# Patient Record
Sex: Male | Born: 1990 | Race: Black or African American | Hispanic: No | Marital: Married
Health system: Northeastern US, Academic
[De-identification: ages and names within clinical notes are randomized; demographics above are authoritative.]

## PROBLEM LIST (undated history)

## (undated) DIAGNOSIS — I10 Essential (primary) hypertension: Secondary | ICD-10-CM

## (undated) DIAGNOSIS — J45909 Unspecified asthma, uncomplicated: Secondary | ICD-10-CM

## (undated) MED FILL — WEGOVY 2.4 MG/0.75 ML SUBCUTANEOUS PEN INJECTOR: 2.4 2.4 mg/0.75 mL | SUBCUTANEOUS | 28 days supply | Qty: 3 | Fill #3 | Status: CN

## (undated) MED FILL — WEGOVY 2.4 MG/0.75 ML SUBCUTANEOUS PEN INJECTOR: 2.4 2.4 mg/0.75 mL | SUBCUTANEOUS | 28 days supply | Qty: 3 | Fill #9 | Status: CN

## (undated) MED FILL — WEGOVY 2.4 MG/0.75 ML SUBCUTANEOUS PEN INJECTOR: 2.4 2.4 mg/0.75 mL | SUBCUTANEOUS | 28 days supply | Qty: 3 | Fill #0 | Status: CN

## (undated) MED FILL — ATOMOXETINE 80 MG CAPSULE: 80 80 mg | ORAL | 90 days supply | Qty: 90 | Fill #2 | Status: CN

---

## 2016-07-09 ENCOUNTER — Emergency Department (HOSPITAL_COMMUNITY): Payer: 59

## 2016-07-09 ENCOUNTER — Encounter (HOSPITAL_COMMUNITY): Payer: Self-pay

## 2016-07-09 ENCOUNTER — Observation Stay (HOSPITAL_COMMUNITY)
Admission: EM | Admit: 2016-07-09 | Discharge: 2016-07-11 | Disposition: A | Discharge status: Home / Self Care | Payer: 59 | Attending: General Surgery | Admitting: General Surgery

## 2016-07-09 DIAGNOSIS — I10 Essential (primary) hypertension: Secondary | ICD-10-CM | POA: Insufficient documentation

## 2016-07-09 DIAGNOSIS — J45909 Unspecified asthma, uncomplicated: Secondary | ICD-10-CM | POA: Diagnosis not present

## 2016-07-09 DIAGNOSIS — Z79899 Other long term (current) drug therapy: Secondary | ICD-10-CM | POA: Diagnosis not present

## 2016-07-09 DIAGNOSIS — K3580 Unspecified acute appendicitis: Principal | ICD-10-CM | POA: Diagnosis present

## 2016-07-09 DIAGNOSIS — R1031 Right lower quadrant pain: Secondary | ICD-10-CM | POA: Diagnosis present

## 2016-07-09 HISTORY — DX: Unspecified asthma, uncomplicated: J45.909

## 2016-07-09 HISTORY — DX: Essential (primary) hypertension: I10

## 2016-07-09 LAB — COMPREHENSIVE METABOLIC PANEL
ALBUMIN: 4.2 g/dL (ref 3.5–5.0)
ALK PHOS: 95 U/L (ref 38–126)
ALT: 36 U/L (ref 17–63)
AST: 24 U/L (ref 15–41)
Anion gap: 11 (ref 5–15)
BILIRUBIN TOTAL: 0.9 mg/dL (ref 0.3–1.2)
BUN: 6 mg/dL (ref 6–20)
CALCIUM: 10.1 mg/dL (ref 8.9–10.3)
CO2: 29 mmol/L (ref 22–32)
CREATININE: 1.2 mg/dL (ref 0.61–1.24)
Chloride: 99 mmol/L — ABNORMAL LOW (ref 101–111)
GFR calc Af Amer: >60 mL/min (ref >60)
GLUCOSE: 98 mg/dL (ref 65–99)
POTASSIUM: 3.9 mmol/L (ref 3.5–5.1)
Sodium: 139 mmol/L (ref 135–145)
TOTAL PROTEIN: 7.8 g/dL (ref 6.5–8.1)

## 2016-07-09 LAB — CBC
HCT: 43.4 % (ref 39.0–52.0)
Hemoglobin: 15 g/dL (ref 13.0–17.0)
MCH: 30.7 pg (ref 26.0–34.0)
MCHC: 34.6 g/dL (ref 30.0–36.0)
MCV: 88.8 fL (ref 78.0–100.0)
PLATELETS: 198 10*3/uL (ref 150–400)
RBC: 4.89 MIL/uL (ref 4.22–5.81)
RDW: 12.7 % (ref 11.5–15.5)
WBC: 11 10*3/uL — AB (ref 4.0–10.5)

## 2016-07-09 LAB — URINALYSIS, ROUTINE W REFLEX MICROSCOPIC
Bilirubin Urine: NEGATIVE
Glucose, UA: NEGATIVE mg/dL
Hgb urine dipstick: NEGATIVE
KETONES UR: NEGATIVE mg/dL
LEUKOCYTES UA: NEGATIVE
NITRITE: NEGATIVE
PH: 7 (ref 5.0–8.0)
PROTEIN: NEGATIVE mg/dL
Specific Gravity, Urine: 1.019 (ref 1.005–1.030)

## 2016-07-09 LAB — LIPASE, BLOOD: Lipase: 17 U/L (ref 11–51)

## 2016-07-09 MED ORDER — IOPAMIDOL (ISOVUE-300) INJECTION 61%
INTRAVENOUS | Status: AC
  Administered 2016-07-09: 100 mL
  Filled 2016-07-09: qty 100

## 2016-07-09 MED ORDER — SODIUM CHLORIDE 0.9 % IV SOLN
Freq: Once | INTRAVENOUS | Status: AC
  Administered 2016-07-09: 23:00:00 via INTRAVENOUS

## 2016-07-09 NOTE — ED Triage Notes (Signed)
Pt endorses RLQ pain that began yesterday with 1 episode of vomiting today. Denies diarrhea. Pt did take laxatives "because I thought I was constipated" VSS.

## 2016-07-09 NOTE — ED Provider Notes (Signed)
[facility]-EMERGENCY DEPT Provider Note   CSN: [id number] Arrival date & time: 07/09/16  2126     History   Chief Complaint Chief Complaint  Patient presents with  . Abdominal Pain    HPI Joshua ManorJohn Glenn is a 26 y.o. male.  This a normally healthy 26 year old male who states this morning he developed central diffuse abdominal pain that has now localized to his right lower quadrant.  He's had no appetite throughout the day had one episode of vomiting this morning.  He thought he was constipated, so he took a laxative.  He did have a normal bowel movement following laxative, which did not alleviate his pain. Is unable to stand straight, as this causes discomfort.      Past Medical History:  Diagnosis Date  . Asthma   . Hypertension     There are no active problems to display for this patient.   History reviewed. No pertinent surgical history.     Home Medications    Prior to Admission medications   Medication Sig Start Date End Date Taking? Authorizing Provider  albuterol (PROVENTIL HFA;VENTOLIN HFA) 108 (90 Base) MCG/ACT inhaler Inhale 2 puffs into the lungs every 6 (six) hours as needed for shortness of breath. 11/10/15 11/09/16 Yes Historical Provider, MD  lisinopril (PRINIVIL,ZESTRIL) 20 MG tablet Take 20 mg by mouth daily. 07/04/16  Yes Historical Provider, MD  magnesium hydroxide (MILK OF MAGNESIA) 400 MG/5ML suspension Take 30 mLs by mouth daily as needed for mild constipation.   Yes Historical Provider, MD  polyethylene glycol (MIRALAX / GLYCOLAX) packet Take 17 g by mouth daily as needed.   Yes Historical Provider, MD    Family History History reviewed. No pertinent family history.  Social History Social History  Substance Use Topics  . Smoking status: Never Smoker  . Smokeless tobacco: Never Used  . Alcohol use Yes     Comment: occ     Allergies   Amlodipine and Latex   Review of Systems Review of Systems  Constitutional: Positive for appetite change.  Negative for fever.  Gastrointestinal: Positive for abdominal pain, nausea and vomiting. Negative for abdominal distention, constipation and diarrhea.  Genitourinary: Negative for dysuria.  Neurological: Negative.   Hematological: Negative.   Psychiatric/Behavioral: Negative.   All other systems reviewed and are negative.    Physical Exam Updated Vital Signs BP 120/75   Pulse 89   Temp 99.3 F (37.4 C) (Oral)   Resp 18   Ht 5\' 10"  (1.778 m)   Wt 117.9 kg   SpO2 95%   BMI 37.31 kg/m   Physical Exam  Constitutional: He appears well-developed and well-nourished. No distress.  HENT:  Head: Normocephalic.  Eyes: Pupils are equal, round, and reactive to light.  Neck: Normal range of motion.  Cardiovascular: Normal rate.   Pulmonary/Chest: Effort normal.  Abdominal: He exhibits no distension. There is tenderness in the right lower quadrant. There is rebound and guarding.  Musculoskeletal: Normal range of motion.  Neurological: He is alert.  Skin: Skin is warm.  Psychiatric: He has a normal mood and affect.  Nursing note and vitals reviewed.    ED Treatments / Results  Labs (all labs ordered are listed, but only abnormal results are displayed) Labs Reviewed  COMPREHENSIVE METABOLIC PANEL - Abnormal; Notable for the following:       Result Value   Chloride 99 (*)    All other components within normal limits  CBC - Abnormal; Notable for the following:  WBC 11.0 (*)    All other components within normal limits  LIPASE, BLOOD  URINALYSIS, ROUTINE W REFLEX MICROSCOPIC    EKG  EKG Interpretation None       Radiology Ct Abdomen Pelvis W Contrast  Result Date: 07/10/2016 CLINICAL DATA:  Right lower quadrant abdominal pain EXAM: CT ABDOMEN AND PELVIS WITH CONTRAST TECHNIQUE: Multidetector CT imaging of the abdomen and pelvis was performed using the standard protocol following bolus administration of intravenous contrast. CONTRAST:  ISOVUE-300 IOPAMIDOL  (ISOVUE-300) INJECTION 61% COMPARISON:  None. FINDINGS: Lower chest: No acute abnormality. Hepatobiliary: No focal liver abnormality is seen. No gallstones, gallbladder wall thickening, or biliary dilatation. Pancreas: Unremarkable. No pancreatic ductal dilatation or surrounding inflammatory changes. Spleen: Normal in size without focal abnormality. Adrenals/Urinary Tract: Adrenal glands are unremarkable. Kidneys are normal, without renal calculi, focal lesion, or hydronephrosis. Bladder is unremarkable. Stomach/Bowel: Stomach is within normal limits. No evidence of bowel wall thickening, distention, or inflammatory changes. Abnormally enlarged appendix up to 13 mm. 9 mm appendicolith in the proximal lumen. Moderate inflammatory change surrounding the appendix. No perforation. No abscess. Sigmoid colon diverticular disease. Vascular/Lymphatic: Non aneurysmal aorta. Multiple right lower quadrant mesenteric lymph nodes likely reactive. Reproductive: Prostate is unremarkable. Other: Small free fluid in the pelvis.  No free air Musculoskeletal: No acute or significant osseous findings. IMPRESSION: 1. Findings consistent with acute appendicitis; there is a 9 mm appendicolith in the proximal lumen of the appendix. No free air. No abscess. 2. Small amount of free fluid in the pelvis 3. Sigmoid colon diverticular changes without acute inflammation Electronically Signed   By: Jasmine Pang M.D.   On: 07/10/2016 00:32    Procedures Procedures (including critical care time)  Medications Ordered in ED Medications  0.9 %  sodium chloride infusion ( Intravenous New Bag/Given 07/09/16 2315)  iopamidol (ISOVUE-300) 61 % injection (100 mLs  Contrast Given 07/09/16 2334)  HYDROmorphone (DILAUDID) injection 1 mg (1 mg Intravenous Given 07/10/16 0057)  ondansetron (ZOFRAN) injection 4 mg (4 mg Intravenous Given 07/10/16 0057)     Initial Impression / Assessment and Plan / ED Course  I have reviewed the triage vital signs  and the nursing notes.  Pertinent labs & imaging results that were available during my care of the patient were reviewed by me and considered in my medical decision making (see chart for details).      Will obtain CT scan, concern for acute appendicitis  Final Clinical Impressions(s) / ED Diagnoses   Final diagnoses:  Acute appendicitis, unspecified acute appendicitis type    New Prescriptions New Prescriptions   No medications on file     Earley Favor, NP 07/09/16 2320    Earley Favor, NP 07/10/16 0126    Laurence Spates, MD 07/10/16 [phone]

## 2016-07-09 NOTE — ED Notes (Signed)
Patient transported to CT 

## 2016-07-10 ENCOUNTER — Encounter (HOSPITAL_COMMUNITY)
Admission: EM | Disposition: A | Discharge status: Home / Self Care | Payer: Self-pay | Source: Home / Self Care | Attending: Emergency Medicine

## 2016-07-10 ENCOUNTER — Observation Stay (HOSPITAL_COMMUNITY): Payer: 59 | Admitting: Certified Registered"

## 2016-07-10 ENCOUNTER — Encounter (HOSPITAL_COMMUNITY): Payer: Self-pay | Admitting: Certified Registered"

## 2016-07-10 DIAGNOSIS — K3580 Unspecified acute appendicitis: Secondary | ICD-10-CM | POA: Diagnosis present

## 2016-07-10 HISTORY — PX: LAPAROSCOPIC APPENDECTOMY: SHX408

## 2016-07-10 SURGERY — APPENDECTOMY, LAPAROSCOPIC
Anesthesia: General | Site: Abdomen

## 2016-07-10 MED ORDER — MIDAZOLAM HCL 2 MG/2ML IJ SOLN
INTRAMUSCULAR | Status: AC
  Filled 2016-07-10: qty 2

## 2016-07-10 MED ORDER — FENTANYL CITRATE (PF) 100 MCG/2ML IJ SOLN
INTRAMUSCULAR | Status: AC
Start: 2016-07-10 — End: 2016-07-10
  Filled 2016-07-10: qty 2

## 2016-07-10 MED ORDER — SUCCINYLCHOLINE CHLORIDE 200 MG/10ML IV SOSY
PREFILLED_SYRINGE | INTRAVENOUS | Status: DC | PRN
  Administered 2016-07-10: 140 mg via INTRAVENOUS

## 2016-07-10 MED ORDER — HYDROCODONE-ACETAMINOPHEN 5-325 MG PO TABS
1.0000 | ORAL_TABLET | ORAL | Status: DC | PRN
  Administered 2016-07-10 – 2016-07-11 (×4): 1 via ORAL
  Filled 2016-07-10 (×4): qty 1

## 2016-07-10 MED ORDER — SUGAMMADEX SODIUM 200 MG/2ML IV SOLN
INTRAVENOUS | Status: DC | PRN
  Administered 2016-07-10: 250 mg via INTRAVENOUS

## 2016-07-10 MED ORDER — LIDOCAINE 2% (20 MG/ML) 5 ML SYRINGE
INTRAMUSCULAR | Status: AC
  Filled 2016-07-10: qty 5

## 2016-07-10 MED ORDER — DEXAMETHASONE SODIUM PHOSPHATE 10 MG/ML IJ SOLN
INTRAMUSCULAR | Status: AC
  Filled 2016-07-10: qty 1

## 2016-07-10 MED ORDER — HYDROMORPHONE HCL 1 MG/ML IJ SOLN: 0.2500 mg | INTRAMUSCULAR | Status: DC | PRN

## 2016-07-10 MED ORDER — MIDAZOLAM HCL 5 MG/5ML IJ SOLN
INTRAMUSCULAR | Status: DC | PRN
  Administered 2016-07-10: 2 mg via INTRAVENOUS

## 2016-07-10 MED ORDER — LIDOCAINE 2% (20 MG/ML) 5 ML SYRINGE
INTRAMUSCULAR | Status: DC | PRN
  Administered 2016-07-10: 100 mg via INTRAVENOUS

## 2016-07-10 MED ORDER — 0.9 % SODIUM CHLORIDE (POUR BTL) OPTIME
TOPICAL | Status: DC | PRN
  Administered 2016-07-10: 1000 mL

## 2016-07-10 MED ORDER — ROCURONIUM BROMIDE 10 MG/ML (PF) SYRINGE
PREFILLED_SYRINGE | INTRAVENOUS | Status: DC | PRN
  Administered 2016-07-10: 10 mg via INTRAVENOUS
  Administered 2016-07-10: 40 mg via INTRAVENOUS

## 2016-07-10 MED ORDER — PROPOFOL 10 MG/ML IV BOLUS
INTRAVENOUS | Status: DC | PRN
  Administered 2016-07-10: 150 mg via INTRAVENOUS

## 2016-07-10 MED ORDER — HYDROMORPHONE HCL 2 MG/ML IJ SOLN
1.0000 mg | Freq: Once | INTRAMUSCULAR | Status: AC
  Administered 2016-07-10: 1 mg via INTRAVENOUS
  Filled 2016-07-10: qty 1

## 2016-07-10 MED ORDER — ONDANSETRON HCL 4 MG/2ML IJ SOLN
4.0000 mg | Freq: Four times a day (QID) | INTRAMUSCULAR | Status: DC | PRN
  Administered 2016-07-10: 4 mg via INTRAVENOUS

## 2016-07-10 MED ORDER — LACTATED RINGERS IV SOLN
INTRAVENOUS | Status: DC
  Administered 2016-07-10: 09:00:00 via INTRAVENOUS

## 2016-07-10 MED ORDER — ENOXAPARIN SODIUM 60 MG/0.6ML ~~LOC~~ SOLN
60.0000 mg | SUBCUTANEOUS | Status: DC
  Administered 2016-07-10: 16:00:00 60 mg via SUBCUTANEOUS
  Filled 2016-07-10 (×2): qty 0.6

## 2016-07-10 MED ORDER — LISINOPRIL 20 MG PO TABS: 20.0000 mg | ORAL_TABLET | Freq: Every day | ORAL | Status: DC

## 2016-07-10 MED ORDER — SODIUM CHLORIDE 0.9 % IR SOLN
Status: DC | PRN
  Administered 2016-07-10: 1000 mL

## 2016-07-10 MED ORDER — ONDANSETRON HCL 4 MG/2ML IJ SOLN
4.0000 mg | Freq: Once | INTRAMUSCULAR | Status: AC
  Administered 2016-07-10: 4 mg via INTRAVENOUS
  Filled 2016-07-10: qty 2

## 2016-07-10 MED ORDER — METRONIDAZOLE IN NACL 5-0.79 MG/ML-% IV SOLN
500.0000 mg | Freq: Three times a day (TID) | INTRAVENOUS | Status: DC
  Administered 2016-07-10: 500 mg via INTRAVENOUS
  Filled 2016-07-10 (×2): qty 100

## 2016-07-10 MED ORDER — ONDANSETRON HCL 4 MG/2ML IJ SOLN
4.0000 mg | Freq: Once | INTRAMUSCULAR | Status: DC | PRN
Start: 2016-07-10 — End: 2016-07-10

## 2016-07-10 MED ORDER — BUPIVACAINE-EPINEPHRINE 0.25% -1:200000 IJ SOLN
INTRAMUSCULAR | Status: DC | PRN
  Administered 2016-07-10: 20 mL

## 2016-07-10 MED ORDER — BUPIVACAINE HCL (PF) 0.25 % IJ SOLN
INTRAMUSCULAR | Status: AC
  Filled 2016-07-10: qty 30

## 2016-07-10 MED ORDER — ONDANSETRON 4 MG PO TBDP: 4.0000 mg | ORAL_TABLET | Freq: Four times a day (QID) | ORAL | Status: DC | PRN

## 2016-07-10 MED ORDER — SUCCINYLCHOLINE CHLORIDE 200 MG/10ML IV SOSY
PREFILLED_SYRINGE | INTRAVENOUS | Status: AC
  Filled 2016-07-10: qty 10

## 2016-07-10 MED ORDER — ROCURONIUM BROMIDE 50 MG/5ML IV SOSY
PREFILLED_SYRINGE | INTRAVENOUS | Status: AC
  Filled 2016-07-10: qty 5

## 2016-07-10 MED ORDER — DEXAMETHASONE SODIUM PHOSPHATE 10 MG/ML IJ SOLN
INTRAMUSCULAR | Status: DC | PRN
  Administered 2016-07-10: 6 mg via INTRAVENOUS

## 2016-07-10 MED ORDER — DIPHENHYDRAMINE HCL 50 MG/ML IJ SOLN: 25.0000 mg | Freq: Four times a day (QID) | INTRAMUSCULAR | Status: DC | PRN

## 2016-07-10 MED ORDER — PROPOFOL 10 MG/ML IV BOLUS
INTRAVENOUS | Status: AC
  Filled 2016-07-10: qty 20

## 2016-07-10 MED ORDER — EPHEDRINE 5 MG/ML INJ
INTRAVENOUS | Status: AC
  Filled 2016-07-10: qty 10

## 2016-07-10 MED ORDER — POTASSIUM CHLORIDE IN NACL 20-0.9 MEQ/L-% IV SOLN
INTRAVENOUS | Status: DC
  Administered 2016-07-10 – 2016-07-11 (×4): via INTRAVENOUS
  Filled 2016-07-10 (×4): qty 1000

## 2016-07-10 MED ORDER — PHENYLEPHRINE 40 MCG/ML (10ML) SYRINGE FOR IV PUSH (FOR BLOOD PRESSURE SUPPORT)
PREFILLED_SYRINGE | INTRAVENOUS | Status: AC
  Filled 2016-07-10: qty 10

## 2016-07-10 MED ORDER — MEPERIDINE HCL 25 MG/ML IJ SOLN: 6.2500 mg | INTRAMUSCULAR | Status: DC | PRN

## 2016-07-10 MED ORDER — FENTANYL CITRATE (PF) 100 MCG/2ML IJ SOLN
INTRAMUSCULAR | Status: AC
  Filled 2016-07-10: qty 2

## 2016-07-10 MED ORDER — CEFTRIAXONE SODIUM 2 G IJ SOLR
2.0000 g | INTRAMUSCULAR | Status: DC
  Administered 2016-07-10: 2 g via INTRAVENOUS
  Filled 2016-07-10: qty 2

## 2016-07-10 MED ORDER — MORPHINE SULFATE (PF) 4 MG/ML IV SOLN
1.0000 mg | INTRAVENOUS | Status: DC | PRN
  Administered 2016-07-10 (×2): 4 mg via INTRAVENOUS
  Filled 2016-07-10 (×2): qty 1

## 2016-07-10 MED ORDER — DIPHENHYDRAMINE HCL 25 MG PO CAPS: 25.0000 mg | ORAL_CAPSULE | Freq: Four times a day (QID) | ORAL | Status: DC | PRN

## 2016-07-10 MED ORDER — FENTANYL CITRATE (PF) 100 MCG/2ML IJ SOLN
INTRAMUSCULAR | Status: DC | PRN
  Administered 2016-07-10: 100 ug via INTRAVENOUS
  Administered 2016-07-10 (×2): 50 ug via INTRAVENOUS

## 2016-07-10 SURGICAL SUPPLY — 34 items
APPLIER CLIP 5 13 M/L LIGAMAX5 (MISCELLANEOUS)
CANISTER SUCT 3000ML PPV (MISCELLANEOUS) ×3 IMPLANT
CHLORAPREP W/TINT 26ML (MISCELLANEOUS) ×3 IMPLANT
CLIP APPLIE 5 13 M/L LIGAMAX5 (MISCELLANEOUS) IMPLANT
CLIP LIGATING HEMO LOK XL GOLD (MISCELLANEOUS) ×3 IMPLANT
COVER SURGICAL LIGHT HANDLE (MISCELLANEOUS) ×3 IMPLANT
DERMABOND ADVANCED (GAUZE/BANDAGES/DRESSINGS) ×2
DERMABOND ADVANCED .7 DNX12 (GAUZE/BANDAGES/DRESSINGS) ×1 IMPLANT
ELECT REM PT RETURN 9FT ADLT (ELECTROSURGICAL) ×3
ELECTRODE REM PT RTRN 9FT ADLT (ELECTROSURGICAL) ×1 IMPLANT
GLOVE BIOGEL PI IND STRL 7.0 (GLOVE) ×1 IMPLANT
GLOVE BIOGEL PI INDICATOR 7.0 (GLOVE) ×2
GLOVE SURG SS PI 7.0 STRL IVOR (GLOVE) ×3 IMPLANT
GOWN STRL REUS W/ TWL LRG LVL3 (GOWN DISPOSABLE) ×2 IMPLANT
GOWN STRL REUS W/TWL LRG LVL3 (GOWN DISPOSABLE) ×4
GRASPER SUT TROCAR 14GX15 (MISCELLANEOUS) ×3 IMPLANT
KIT BASIN OR (CUSTOM PROCEDURE TRAY) ×3 IMPLANT
KIT ROOM TURNOVER OR (KITS) ×3 IMPLANT
NS IRRIG 1000ML POUR BTL (IV SOLUTION) ×3 IMPLANT
PAD ARMBOARD 7.5X6 YLW CONV (MISCELLANEOUS) ×6 IMPLANT
POUCH RETRIEVAL ECOSAC 10 (ENDOMECHANICALS) ×1 IMPLANT
POUCH RETRIEVAL ECOSAC 10MM (ENDOMECHANICALS) ×2
SCISSORS LAP 5X35 DISP (ENDOMECHANICALS) ×3 IMPLANT
SET IRRIG TUBING LAPAROSCOPIC (IRRIGATION / IRRIGATOR) ×3 IMPLANT
SLEEVE ENDOPATH XCEL 5M (ENDOMECHANICALS) ×3 IMPLANT
SPECIMEN JAR SMALL (MISCELLANEOUS) ×3 IMPLANT
SUT MNCRL AB 4-0 PS2 18 (SUTURE) ×3 IMPLANT
TOWEL OR 17X24 6PK STRL BLUE (TOWEL DISPOSABLE) ×3 IMPLANT
TOWEL OR 17X26 10 PK STRL BLUE (TOWEL DISPOSABLE) ×3 IMPLANT
TRAY FOLEY CATH 16FR SILVER (SET/KITS/TRAYS/PACK) IMPLANT
TRAY LAPAROSCOPIC MC (CUSTOM PROCEDURE TRAY) ×3 IMPLANT
TROCAR XCEL NON-BLD 11X100MML (ENDOMECHANICALS) ×3 IMPLANT
TROCAR XCEL NON-BLD 5MMX100MML (ENDOMECHANICALS) ×3 IMPLANT
TUBING INSUFFLATION (TUBING) ×3 IMPLANT

## 2016-07-10 NOTE — Progress Notes (Signed)
Pre Procedure note for inpatients:   Joshua Glenn has been scheduled for Procedure(s): APPENDECTOMY LAPAROSCOPIC (N/A) today. The various methods of treatment have been discussed with the patient. After consideration of the risks, benefits and treatment options the patient has consented to the planned procedure.   The patient has been seen and labs reviewed. There are no changes in the patient's condition to prevent proceeding with the planned procedure today.  Recent labs:  Lab Results  Component Value Date   WBC 11.0 (H) 07/09/2016   HGB 15.0 07/09/2016   HCT 43.4 07/09/2016   PLT 198 07/09/2016   GLUCOSE 98 07/09/2016   ALT 36 07/09/2016   AST 24 07/09/2016   NA 139 07/09/2016   K 3.9 07/09/2016   CL 99 (L) 07/09/2016   CREATININE 1.20 07/09/2016   BUN 6 07/09/2016   CO2 29 07/09/2016    Rodman PickleLuke Aaron Paco Cislo, MD 07/10/2016 9:29 AM

## 2016-07-10 NOTE — Anesthesia Procedure Notes (Signed)
Procedure Name: Intubation Date/Time: 07/10/2016 10:14 AM Performed by: Myna Bright Pre-anesthesia Checklist: Patient identified, Emergency Drugs available, Suction available and Patient being monitored Patient Re-evaluated:Patient Re-evaluated prior to inductionOxygen Delivery Method: Circle system utilized Preoxygenation: Pre-oxygenation with 100% oxygen Intubation Type: IV induction, Rapid sequence and Cricoid Pressure applied Laryngoscope Size: Mac and 4 Grade View: Grade I Tube type: Oral Tube size: 7.5 mm Number of attempts: 1 Airway Equipment and Method: Stylet Placement Confirmation: ETT inserted through vocal cords under direct vision,  positive ETCO2 and breath sounds checked- equal and bilateral Secured at: 22 cm Tube secured with: Tape Dental Injury: Teeth and Oropharynx as per pre-operative assessment

## 2016-07-10 NOTE — H&P (Signed)
Joshua Glenn is an 26 y.o. male.   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 He developed central abdominal pain yesterday that then progressed to the right lower quadrant. He had one episode of nausea and vomiting. The pain is described as sharp and moderate in intensity. A CT scan of the abdomen was performed showing acute appendicitis with appendicolith. He denies fevers. He is otherwise without complaints.  Past Medical History:  Diagnosis Date  . Asthma   . Hypertension     History reviewed. No pertinent surgical history.  History reviewed. No pertinent family history. Social History:  reports that he has never smoked. He has never used smokeless tobacco. He reports that he drinks alcohol. He reports that he does not use drugs.  Allergies:  Allergies  Allergen Reactions  . Amlodipine Palpitations  . Latex Rash     (Not in a hospital admission)  Results for orders placed or performed during the hospital encounter of 07/09/16 (from the past 48 hour(s))  Lipase, blood     Status: None   Collection Time: 07/09/16  9:40 PM  Result Value Ref Range   Lipase 17 11 - 51 U/L  Comprehensive metabolic panel     Status: Abnormal   Collection Time: 07/09/16  9:40 PM  Result Value Ref Range   Sodium 139 135 - 145 mmol/L   Potassium 3.9 3.5 - 5.1 mmol/L   Chloride 99 (L) 101 - 111 mmol/L   CO2 29 22 - 32 mmol/L   Glucose, Bld 98 65 - 99 mg/dL   BUN 6 6 - 20 mg/dL   Creatinine, Ser 1.20 0.61 - 1.24 mg/dL   Calcium 10.1 8.9 - 10.3 mg/dL   Total Protein 7.8 6.5 - 8.1 g/dL   Albumin 4.2 3.5 - 5.0 g/dL   AST 24 15 - 41 U/L   ALT 36 17 - 63 U/L   Alkaline Phosphatase 95 38 - 126 U/L   Total Bilirubin 0.9 0.3 - 1.2 mg/dL   GFR calc non Af Amer >60 >60 mL/min   GFR calc Af Amer >60 >60 mL/min    Comment: (NOTE) The eGFR has been calculated using the CKD EPI equation. This calculation has not been  validated in all clinical situations. eGFR's persistently <60 mL/min signify possible Chronic Kidney Disease.    Anion gap 11 5 - 15  CBC     Status: Abnormal   Collection Time: 07/09/16  9:40 PM  Result Value Ref Range   WBC 11.0 (H) 4.0 - 10.5 K/uL   RBC 4.89 4.22 - 5.81 MIL/uL   Hemoglobin 15.0 13.0 - 17.0 g/dL   HCT 43.4 39.0 - 52.0 %   MCV 88.8 78.0 - 100.0 fL   MCH 30.7 26.0 - 34.0 pg   MCHC 34.6 30.0 - 36.0 g/dL   RDW 12.7 11.5 - 15.5 %   Platelets 198 150 - 400 K/uL  Urinalysis, Routine w reflex microscopic     Status: None   Collection Time: 07/09/16  9:40 PM  Result Value Ref Range   Color, Urine YELLOW YELLOW   APPearance CLEAR CLEAR   Specific Gravity, Urine 1.019 1.005 - 1.030   pH 7.0 5.0 - 8.0   Glucose, UA NEGATIVE NEGATIVE mg/dL   Hgb urine dipstick NEGATIVE NEGATIVE   Bilirubin Urine NEGATIVE NEGATIVE   Ketones, ur NEGATIVE NEGATIVE mg/dL   Protein, ur NEGATIVE NEGATIVE mg/dL  Nitrite NEGATIVE NEGATIVE   Leukocytes, UA NEGATIVE NEGATIVE   Ct Abdomen Pelvis W Contrast  Result Date: 07/10/2016 CLINICAL DATA:  Right lower quadrant abdominal pain EXAM: CT ABDOMEN AND PELVIS WITH CONTRAST TECHNIQUE: Multidetector CT imaging of the abdomen and pelvis was performed using the standard protocol following bolus administration of intravenous contrast. CONTRAST:  176m ISOVUE-300 IOPAMIDOL (ISOVUE-300) INJECTION 61% COMPARISON:  None. FINDINGS: Lower chest: No acute abnormality. Hepatobiliary: No focal liver abnormality is seen. No gallstones, gallbladder wall thickening, or biliary dilatation. Pancreas: Unremarkable. No pancreatic ductal dilatation or surrounding inflammatory changes. Spleen: Normal in size without focal abnormality. Adrenals/Urinary Tract: Adrenal glands are unremarkable. Kidneys are normal, without renal calculi, focal lesion, or hydronephrosis. Bladder is unremarkable. Stomach/Bowel: Stomach is within normal limits. No evidence of bowel wall thickening,  distention, or inflammatory changes. Abnormally enlarged appendix up to 13 mm. 9 mm appendicolith in the proximal lumen. Moderate inflammatory change surrounding the appendix. No perforation. No abscess. Sigmoid colon diverticular disease. Vascular/Lymphatic: Non aneurysmal aorta. Multiple right lower quadrant mesenteric lymph nodes likely reactive. Reproductive: Prostate is unremarkable. Other: Small free fluid in the pelvis.  No free air Musculoskeletal: No acute or significant osseous findings. IMPRESSION: 1. Findings consistent with acute appendicitis; there is a 9 mm appendicolith in the proximal lumen of the appendix. No free air. No abscess. 2. Small amount of free fluid in the pelvis 3. Sigmoid colon diverticular changes without acute inflammation Electronically Signed   By: KDonavan FoilM.D.   On: 07/10/2016 00:32    Review of Systems  All other systems reviewed and are negative.   Blood pressure 127/72, pulse 93, temperature 99.3 F (37.4 C), temperature source Oral, resp. rate 18, height _0  (1.778 m), weight 117.9 kg (260 lb), SpO2 97 %. Physical Exam  Constitutional: He is oriented to person, place, and time. He appears well-developed and well-nourished. No distress.  HENT:  Head: Normocephalic and atraumatic.  Right Ear: External ear normal.  Left Ear: External ear normal.  Nose: Nose normal.  Eyes: Pupils are equal, round, and reactive to light. Right eye exhibits no discharge. Left eye exhibits no discharge. No scleral icterus.  Cardiovascular: Normal rate, regular rhythm and normal heart sounds.   No murmur heard. Respiratory: Effort normal. No respiratory distress.  GI: Soft. He exhibits no distension. There is tenderness. There is guarding.  There is tenderness with guarding in the right lower quadrant  Neurological: He is alert and oriented to person, place, and time.  Skin: Skin is warm and dry. No erythema.  Psychiatric: His behavior is normal. Judgment normal.      Assessment/Plan Acute appendicitis  He'll be admitted and placed on IV antibiotics. We will proceed to the operating room later today for a laparoscopic appendectomy. I discussed the reasons for this with him and he understands and wishes to proceed  BHarl Bowie MD 07/10/2016, 1:45 AM

## 2016-07-10 NOTE — ED Notes (Signed)
Attempted to call report

## 2016-07-10 NOTE — ED Notes (Signed)
Admitting Provider at bedside. 

## 2016-07-10 NOTE — Op Note (Signed)
Preoperative diagnosis: acute appendicitis with peritonitis  Postoperative diagnosis: Same   Procedure: laparoscopic appendectomy  Surgeon: Feliciana RossettiLuke Joseangel Nettleton, M.D.  Asst: none  Anesthesia: Gen.   Indications for procedure: Joshua ManorJohn Tolsma is a 26 y.o. male with symptoms of pain in right lower quadrant and nausea consistent with acute appendicitis. Confirmed by CT scan and laboratory values.  Description of procedure: The patient was brought into the operative suite, placed supine. Anesthesia was administered with endotracheal tube. The patient's left arm was tucked. All pressure points were offloaded by foam padding. The patient was prepped and draped in the usual sterile fashion.  A transverse incision was made to the left of the umbilicus and a 11mm optical entry trocar was used to gain access to that abdominal cavity.  Pneumoperitoneum was applied with high flow low pressure.  1 5mm trocar were placed in the suprapubic space and 1 5mm trocar in the LLQ. Pneumoperitoneum was applied with high flow low pressure.  2 5mm trocars were placed, one in the suprapubic space, one in the LLQ. All trocars sites were first anesthesized with 0.25% marcaine with epinephrine. Next the patient was placed in trendelenberg, rotated to the left. The omentum was retracted cephalad. The cecum and appendix were identified. The base of the appendix was dissected and a window through the mesoappendix was created with blunt dissection. The base of the appendix was clipped with hemolock clips and transected. Next the mesoappendix was further dissected and 1 single hemolock clip was used to ligate the appendiceal artery. Cautery was then used to dissect the rest of the mesoappendix free.  The appendix was placed in a specimen bag. The pelvis and RLQ were irrigated. The appendix was removed via the umbilicus. 0 vicryl was used to close the fascial defect. Pneumoperitoneum was removed, all trocars were removed. All incisions were  closed with 4-0 monocryl subcuticular stitch. The patient woke from anesthesia and was brought to PACU in stable condition.  Findings: acute appendix  Specimen: appendix  Blood loss: 30 ml  Local anesthesia: 20 ml 0.5% marcaine  Complications: none  Feliciana RossettiLuke Reba Hulett, M.D. General, Bariatric, & Minimally Invasive Surgery [facility]

## 2016-07-10 NOTE — Transfer of Care (Signed)
Immediate Anesthesia Transfer of Care Note  Patient: Joshua ManorJohn Glenn  Procedure(s) Performed: Procedure(s): APPENDECTOMY LAPAROSCOPIC (N/A)  Patient Location: PACU  Anesthesia Type:General  Level of Consciousness: awake, alert , oriented and patient cooperative  Airway & Oxygen Therapy: Patient Spontanous Breathing and Patient connected to nasal cannula oxygen  Post-op Assessment: Report given to RN and Post -op Vital signs reviewed and stable  Post vital signs: Reviewed and stable  Last Vitals:  Vitals:   07/10/16 0700 07/10/16 0800  BP: 116/67 117/73  Pulse: 91 83  Resp:  16  Temp:      Last Pain:  Vitals:   07/10/16 0813  TempSrc:   PainSc: 2          Complications: No apparent anesthesia complications

## 2016-07-10 NOTE — Anesthesia Postprocedure Evaluation (Signed)
Anesthesia Post Note  Patient: Joshua Glenn  Procedure(s) Performed: Procedure(s) (LRB): APPENDECTOMY LAPAROSCOPIC (N/A)  Patient location during evaluation: PACU Anesthesia Type: General Level of consciousness: awake and alert Pain management: pain level controlled Vital Signs Assessment: post-procedure vital signs reviewed and stable Respiratory status: spontaneous breathing, nonlabored ventilation, respiratory function stable and patient connected to nasal cannula oxygen Cardiovascular status: blood pressure returned to baseline and stable Postop Assessment: no signs of nausea or vomiting Anesthetic complications: no       Last Vitals:  Vitals:   07/10/16 1236 07/10/16 1644  BP: 132/78 126/70  Pulse: 95 94  Resp: 18 18  Temp: 37.4 C 36.9 C    Last Pain:  Vitals:   07/10/16 1644  TempSrc: Oral  PainSc:                  Peace Noyes DAVID

## 2016-07-10 NOTE — Anesthesia Preprocedure Evaluation (Signed)
Anesthesia Evaluation  Patient identified by MRN, date of birth, ID band Patient awake    Reviewed: Allergy & Precautions, NPO status , Patient's Chart, lab work & pertinent test results  Airway Mallampati: I  TM Distance: >3 FB Neck ROM: Full    Dental   Pulmonary    Pulmonary exam normal        Cardiovascular hypertension, Pt. on medications Normal cardiovascular exam     Neuro/Psych    GI/Hepatic   Endo/Other    Renal/GU      Musculoskeletal   Abdominal   Peds  Hematology   Anesthesia Other Findings   Reproductive/Obstetrics                             Anesthesia Physical Anesthesia Plan  ASA: II  Anesthesia Plan: General   Post-op Pain Management:    Induction: Intravenous, Rapid sequence and Cricoid pressure planned  Airway Management Planned: Oral ETT  Additional Equipment:   Intra-op Plan:   Post-operative Plan: Extubation in OR  Informed Consent: I have reviewed the patients History and Physical, chart, labs and discussed the procedure including the risks, benefits and alternatives for the proposed anesthesia with the patient or authorized representative who has indicated his/her understanding and acceptance.     Plan Discussed with: CRNA and Surgeon  Anesthesia Plan Comments:         Anesthesia Quick Evaluation

## 2016-07-11 ENCOUNTER — Encounter (HOSPITAL_COMMUNITY): Payer: Self-pay | Admitting: General Surgery

## 2016-07-11 LAB — HIV ANTIBODY (ROUTINE TESTING W REFLEX): HIV SCREEN 4TH GENERATION: NONREACTIVE

## 2016-07-11 MED ORDER — HYDROCODONE-ACETAMINOPHEN 5-325 MG PO TABS: 1.0000 | ORAL_TABLET | ORAL | 0 refills | Status: AC | PRN

## 2016-07-11 NOTE — Discharge Instructions (Signed)
°LAPAROSCOPIC SURGERY: POST OP INSTRUCTIONS  °1. DIET: Follow a light bland diet the first 24 hours after arrival home, such as soup, liquids, crackers, etc. Be sure to include lots of fluids daily. Avoid fast food or heavy meals as your are more likely to get nauseated. Eat a low fat the next few days after surgery.  °2. Take your usually prescribed home medications unless otherwise directed. °3. PAIN CONTROL:  °1. Pain is best controlled by a usual combination of three different methods TOGETHER:  °1. Ice/Heat °2. Over the counter pain medication °3. Prescription pain medication °2. Most patients will experience some swelling and bruising around the incisions. Ice packs or heating pads (30-60 minutes up to 6 times a day) will help. Use ice for the first few days to help decrease swelling and bruising, then switch to heat to help relax tight/sore spots and speed recovery. Some people prefer to use ice alone, heat alone, alternating between ice & heat. Experiment to what works for you. Swelling and bruising can take several weeks to resolve.  °3. It is helpful to take an over-the-counter pain medication regularly for the first few weeks. Choose one of the following that works best for you:  °1. Naproxen (Aleve, etc) Two 220mg tabs twice a day °2. Ibuprofen (Advil, etc) Three 200mg tabs four times a day (every meal & bedtime) °3. Acetaminophen (Tylenol, etc) 500-650mg four times a day (every meal & bedtime) °4. A prescription for pain medication (such as oxycodone, hydrocodone, etc) should be given to you upon discharge. Take your pain medication as prescribed.  °1. If you are having problems/concerns with the prescription medicine (does not control pain, nausea, vomiting, rash, itching, etc), please call us [phone] to see if we need to switch you to a different pain medicine that will work better for you and/or control your side effect better. °2. If you need a refill on your pain medication, please contact  your pharmacy. They will contact our office to request authorization. Prescriptions will not be filled after 5 pm or on week-ends. °4. Avoid getting constipated. Between the surgery and the pain medications, it is common to experience some constipation. Increasing fluid intake and taking a fiber supplement (such as Metamucil, Citrucel, FiberCon, MiraLax, etc) 1-2 times a day regularly will usually help prevent this problem from occurring. A mild laxative (prune juice, Milk of Magnesia, MiraLax, etc) should be taken according to package directions if there are no bowel movements after 48 hours.  °5. Watch out for diarrhea. If you have many loose bowel movements, simplify your diet to bland foods & liquids for a few days. Stop any stool softeners and decrease your fiber supplement. Switching to mild anti-diarrheal medications (Kayopectate, Pepto Bismol) can help. If this worsens or does not improve, please call us. °6. Wash / shower every day. You may shower over the dressings as they are waterproof. Continue to shower over incision(s) after the dressing is off. If there is glue over the incisions try not to pick it off, let it fall off naturally. °7. Remove your waterproof bandages 5 days after surgery. You may leave the incision open to air. You may replace a dressing/Band-Aid to cover the incision for comfort if you wish.  °8. ACTIVITIES as tolerated:  °1. You may resume regular (light) daily activities beginning the next day--such as daily self-care, walking, climbing stairs--gradually increasing activities as tolerated. If you can walk 30 minutes without difficulty, it is safe to try more intense activity   such as jogging, treadmill, bicycling, low-impact aerobics, swimming, etc. °2. Save the most intensive and strenuous activity for last such as sit-ups, heavy lifting, contact sports, etc Refrain from any heavy lifting or straining until you are off narcotics for pain control. For the first 2-3 weeks do not lift  over 10-15lb.  °3. DO NOT PUSH THROUGH PAIN. Let pain be your guide: If it hurts to do something, don't do it. Pain is your body warning you to avoid that activity for another week until the pain goes down. °4. You may drive when you are no longer taking prescription pain medication, you can comfortably wear a seatbelt, and you can safely maneuver your car and apply brakes. °5. You may have sexual intercourse when it is comfortable.  °9. FOLLOW UP in our office  °1. Please call [facility] at [phone] to set up an appointment to see your surgeon in the office for a follow-up appointment approximately 2-3 weeks after your surgery. °2. Make sure that you call for this appointment the day you arrive home to insure a convenient appointment time. °     10. IF YOU HAVE DISABILITY OR FAMILY LEAVE FORMS, BRING THEM TO THE               OFFICE FOR PROCESSING.  ° °WHEN TO CALL US [phone]:  °1. Poor pain control °2. Reactions / problems with new medications (rash/itching, nausea, etc)  °3. Fever over 101.5 F (38.5 C) °4. Inability to urinate °5. Nausea and/or vomiting °6. Worsening swelling or bruising °7. Continued bleeding from incision. °8. Increased pain, redness, or drainage from the incision ° °The clinic staff is available to answer your questions during regular business hours (8:30am-5pm). Please don’t hesitate to call and ask to speak to one of our nurses for clinical concerns.  °If you have a medical emergency, go to the nearest emergency room or call 911.  °A surgeon from [facility] is always on call at the hospitals  ° °[facility]  °[address], [address], [place], [place] [address] ?  °MAIN: [phone] ? TOLL FREE: [phone] ?  °FAX [phone]  °[email or web address] ° ° °

## 2016-07-11 NOTE — Progress Notes (Signed)
AVS discharge instructions was reviewed with patient. Pt was also given prescription for Vicodin to take to his pharmacy.Pt denied having any questions. Pt ambulated with his wife to his transportation.

## 2016-07-11 NOTE — Discharge Summary (Signed)
[facility] Discharge Summary   Patient ID: Joshua Glenn MRN: [id number] DOB/AGE: 1991-01-23 26 y.o.  Admit date: 07/09/2016 Discharge date: 07/11/2016  Admitting Diagnosis: Acute appendicitis  Discharge Diagnosis Patient Active Problem List   Diagnosis Date Noted  . Appendicitis, acute 07/10/2016    Consultants None  Imaging: Ct Abdomen Pelvis W Contrast  Result Date: 07/10/2016 CLINICAL DATA:  Right lower quadrant abdominal pain EXAM: CT ABDOMEN AND PELVIS WITH CONTRAST TECHNIQUE: Multidetector CT imaging of the abdomen and pelvis was performed using the standard protocol following bolus administration of intravenous contrast. CONTRAST:  ISOVUE-300 IOPAMIDOL (ISOVUE-300) INJECTION 61% COMPARISON:  None. FINDINGS: Lower chest: No acute abnormality. Hepatobiliary: No focal liver abnormality is seen. No gallstones, gallbladder wall thickening, or biliary dilatation. Pancreas: Unremarkable. No pancreatic ductal dilatation or surrounding inflammatory changes. Spleen: Normal in size without focal abnormality. Adrenals/Urinary Tract: Adrenal glands are unremarkable. Kidneys are normal, without renal calculi, focal lesion, or hydronephrosis. Bladder is unremarkable. Stomach/Bowel: Stomach is within normal limits. No evidence of bowel wall thickening, distention, or inflammatory changes. Abnormally enlarged appendix up to 13 mm. 9 mm appendicolith in the proximal lumen. Moderate inflammatory change surrounding the appendix. No perforation. No abscess. Sigmoid colon diverticular disease. Vascular/Lymphatic: Non aneurysmal aorta. Multiple right lower quadrant mesenteric lymph nodes likely reactive. Reproductive: Prostate is unremarkable. Other: Small free fluid in the pelvis.  No free air Musculoskeletal: No acute or significant osseous findings. IMPRESSION: 1. Findings consistent with acute appendicitis; there is a 9 mm appendicolith in the proximal lumen of the appendix. No free air.  No abscess. 2. Small amount of free fluid in the pelvis 3. Sigmoid colon diverticular changes without acute inflammation Electronically Signed   By: Jasmine Pang M.D.   On: 07/10/2016 00:32    Procedures Dr. Sheliah Hatch (07/10/16) - Laparoscopic Appendectomy  Hospital Course:  Joshua Glenn is a 25yo male who presented to [facility] 07/10/16 with central abdominal pain that migrated to the RLQ.  Workup showed acute appendicitis with appendicolith.  Patient was admitted and underwent procedure listed above.  Tolerated procedure well and was transferred to the floor.  Diet was advanced as tolerated.  On POD1 the patient was voiding well, tolerating diet, ambulating well, pain well controlled, vital signs stable, incisions c/d/i and felt stable for discharge home.  Patient will follow up in our office in 3 weeks and knows to call with questions or concerns.   I have personally reviewed the patients medication history on the [place] controlled substance database.   Physical Exam: General:  Alert, NAD, pleasant, comfortable Pulm: effort normal Abd:  Soft, ND, NT, multiple well healing lap incisions C/D/I  Allergies as of 07/11/2016      Reactions   Amlodipine Palpitations   Latex Rash      Medication List    TAKE these medications   albuterol 108 (90 Base) MCG/ACT inhaler Commonly known as:  PROVENTIL HFA;VENTOLIN HFA Inhale 2 puffs into the lungs every 6 (six) hours as needed for shortness of breath.   HYDROcodone-acetaminophen 5-325 MG tablet Commonly known as:  NORCO/VICODIN Take 1 tablet by mouth every 4 (four) hours as needed for moderate pain.   lisinopril 20 MG tablet Commonly known as:  PRINIVIL,ZESTRIL Take 20 mg by mouth daily.   magnesium hydroxide 400 MG/5ML suspension Commonly known as:  MILK OF MAGNESIA Take 30 mLs by mouth daily as needed for mild constipation.   polyethylene glycol packet Commonly known as:  MIRALAX / GLYCOLAX Take 17 g by mouth  daily as needed.         Follow-up Information    [facility] [place].   Specialty:  General Surgery Why:  Your appointment is 08/02/16 at 10:15AM, please arrive at least 30 min before your appointment to complete your check in paperwork.  If you are unable to arrive 30 min prior to your appointment time we may have to cancel or reschedule you. Contact information: [address] [place] [id number] [phone]          Signed: Edson SnowballBROOKE A MILLER, [facility] 07/11/2016, 9:38 AM Pager: [phone] Consults: [phone] Mon-Fri 7:00 am-4:30 pm Sat-Sun 7:00 am-11:30 am

## 2016-07-11 NOTE — Progress Notes (Signed)
Pt walked the entire length of the unit without any signs of distress.

## 2018-02-03 IMAGING — CT CT ABD-PELV W/ CM
2 of 4 series · 9 of 46 positions shown, 10 images · IV contrast (Iodine)
Comparison: None.

CLINICAL DATA: Right lower quadrant abdominal pain

EXAM:
CT ABDOMEN AND PELVIS WITH CONTRAST
TECHNIQUE: Multidetector CT imaging of the abdomen and pelvis was performed
using the standard protocol following bolus administration of
intravenous contrast.
CONTRAST:  100mL 7NLI0O-G88 IOPAMIDOL (7NLI0O-G88) INJECTION 61%

[Series 201: routine, idose (2) · axial · 0.81mm/px · z∈[+89,+504]mm · 6 of 101 slices shown, 7 images]
[im 9/101  soft-tissue]
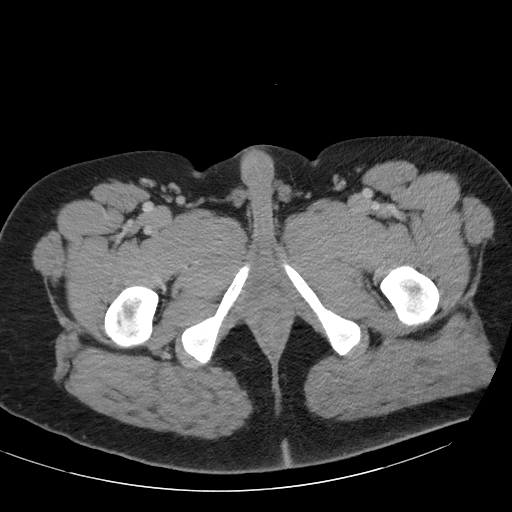
[im 9/101  bone]
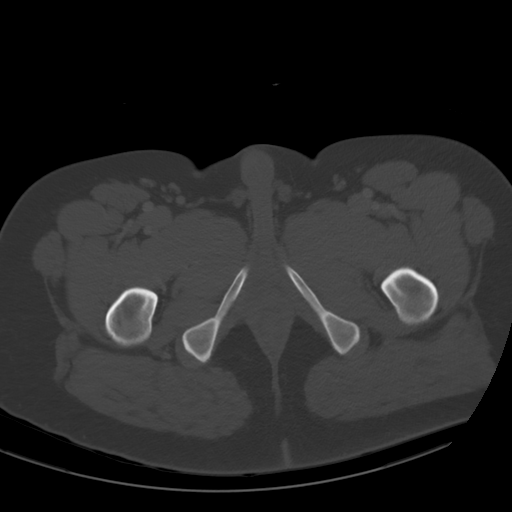
[im 27/101  soft-tissue]
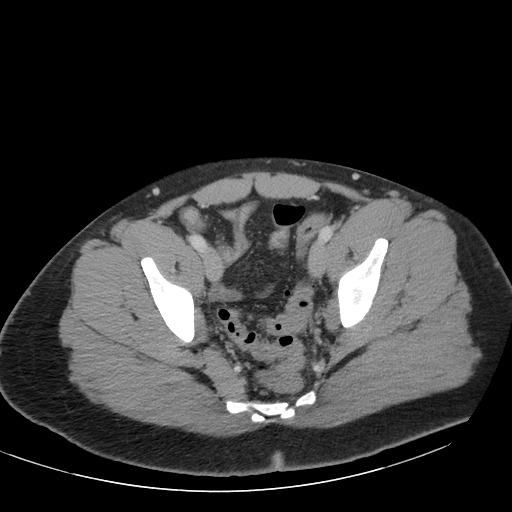
[im 44/101  soft-tissue]
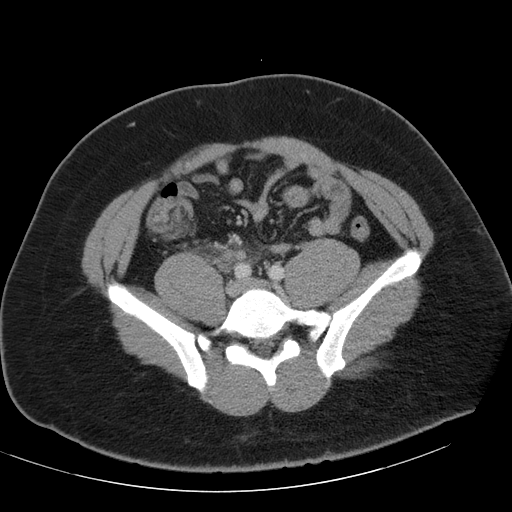
[im 57/101  soft-tissue]
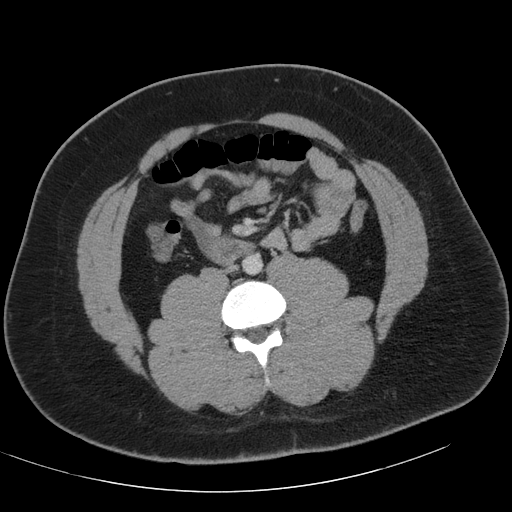
[im 74/101  soft-tissue]
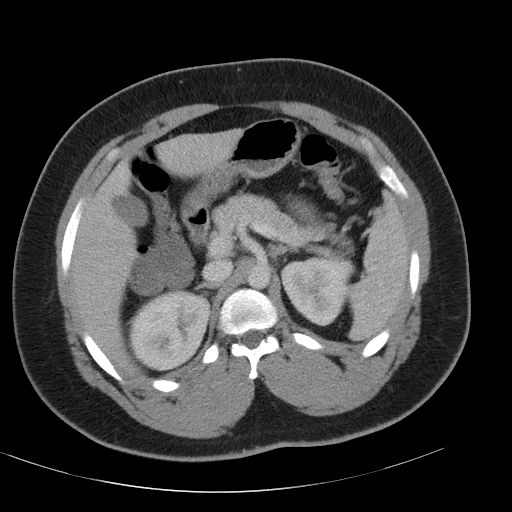
[im 92/101  soft-tissue]
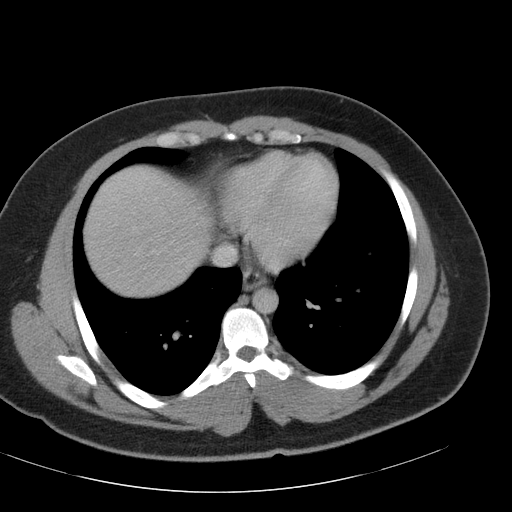

[Series 203: coronals, idose (2) · coronal · 0.45mm/px · 3 of 136 slices shown]
[im 46/136  soft-tissue]
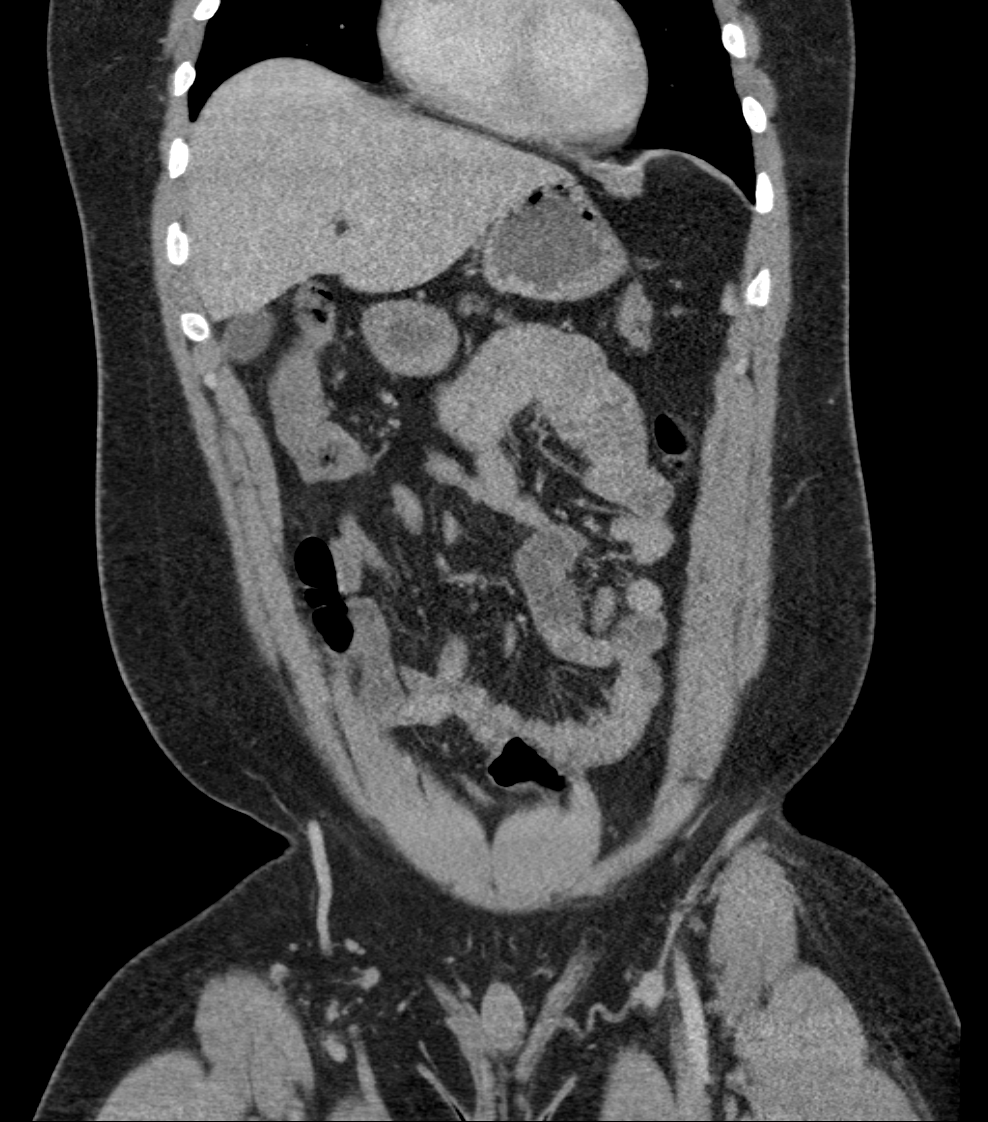
[im 61/136  soft-tissue]
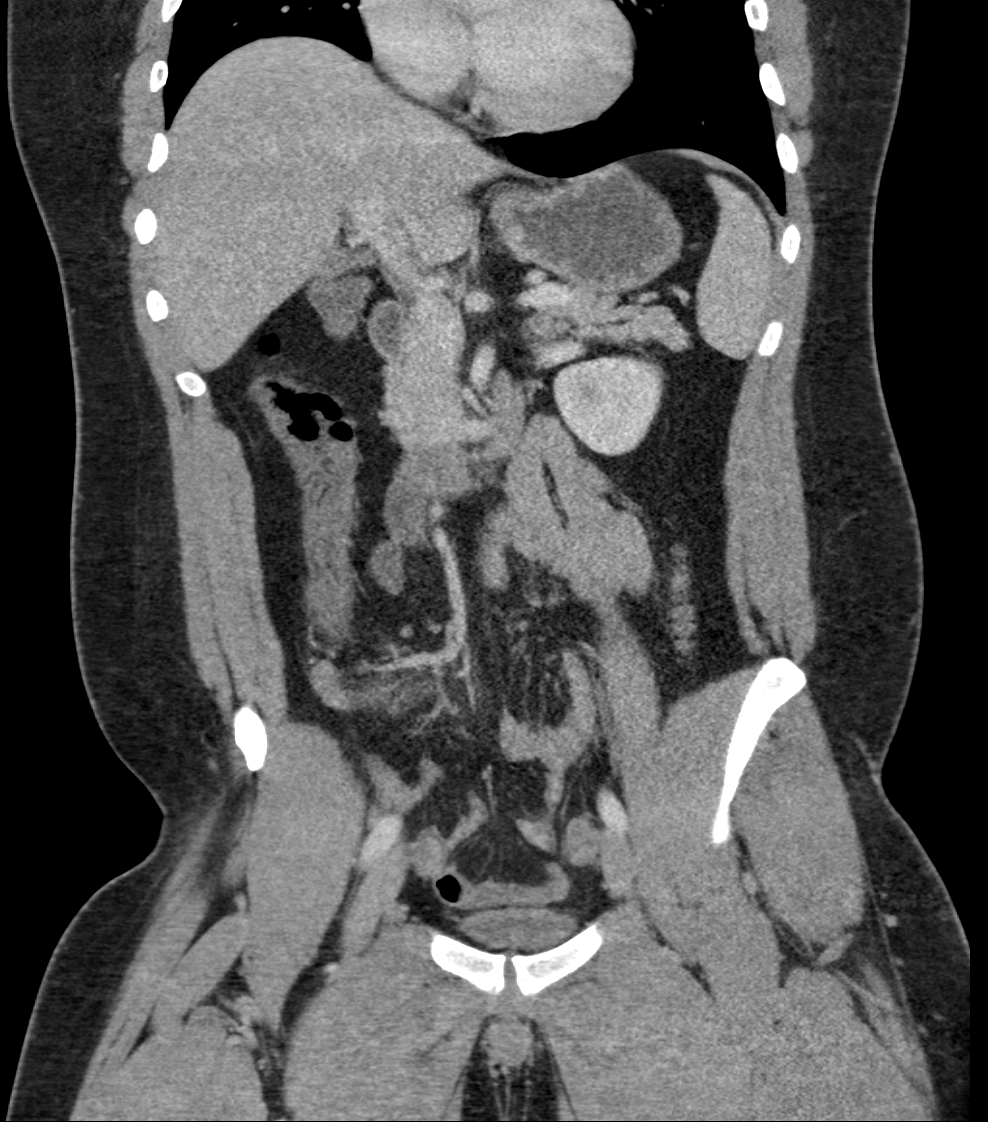
[im 76/136  soft-tissue]
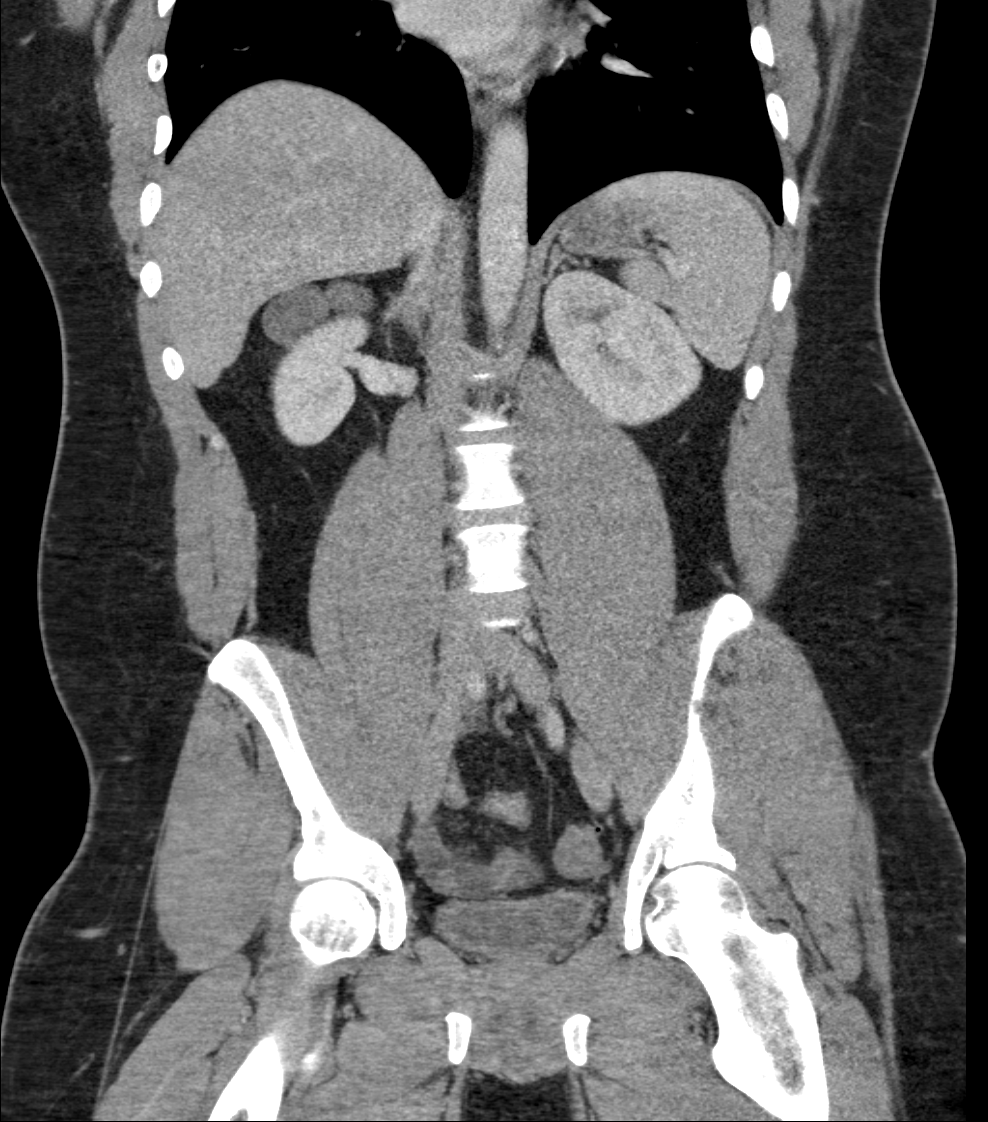

[9 of 46 positions shown; findings below may reference images not displayed]

FINDINGS: Lower chest: No acute abnormality.

Hepatobiliary: No focal liver abnormality is seen. No gallstones,
gallbladder wall thickening, or biliary dilatation.

Pancreas: Unremarkable. No pancreatic ductal dilatation or
surrounding inflammatory changes.

Spleen: Normal in size without focal abnormality.

Adrenals/Urinary Tract: Adrenal glands are unremarkable. Kidneys are
normal, without renal calculi, focal lesion, or hydronephrosis.
Bladder is unremarkable.

Stomach/Bowel: Stomach is within normal limits. No evidence of bowel
wall thickening, distention, or inflammatory changes. Abnormally
enlarged appendix up to 13 mm. 9 mm appendicolith in the proximal
lumen. Moderate inflammatory change surrounding the appendix. No
perforation. No abscess. Sigmoid colon diverticular disease.

Vascular/Lymphatic: Non aneurysmal aorta. Multiple right lower
quadrant mesenteric lymph nodes likely reactive.

Reproductive: Prostate is unremarkable.

Other: Small free fluid in the pelvis.  No free air

Musculoskeletal: No acute or significant osseous findings.
IMPRESSION: 1. Findings consistent with acute appendicitis; there is a 9 mm
appendicolith in the proximal lumen of the appendix. No free air. No
abscess.
2. Small amount of free fluid in the pelvis
3. Sigmoid colon diverticular changes without acute inflammation

## 2022-11-22 ENCOUNTER — Ambulatory Visit
Admit: 2022-11-22 | Discharge: 2022-11-22 | Payer: BLUE CROSS/BLUE SHIELD | Attending: Physician Assistant | Primary: Internal Medicine

## 2022-11-22 DIAGNOSIS — F900 Attention-deficit hyperactivity disorder, predominantly inattentive type: Secondary | ICD-10-CM

## 2022-11-22 NOTE — Assessment & Plan Note (Signed)
-   stable  - continue albuterol inhaler prn

## 2022-11-22 NOTE — Assessment & Plan Note (Addendum)
-   on straterra 80mg  daily , tolerating well no side effects  - will discuss with PCP regarding script   - obtain prior records   - consider psych referral

## 2022-11-22 NOTE — Progress Notes (Signed)
[facility]  [address] [place] [address]  Dept: [phone]  Dept Fax: [phone]      Subjective   Subjective     HPI  32 y.o. male patient who presents for Med Refill and Establish Care.      Patient to establish care with Dr Marga Hoots   Prior PCP - [facility]     HPI      ADHD:  - on Blase Mess   - takes daily   -diagnosed during residency   - diagnosed by PCP   - has been on medication since 12/2021  -tolerating well ,no side effects   - prior labs and ekg- done with prior PCP wnl per pt     Obesity--> Overweight   - on wegovy 2.4mg    - tolerating well , no current side effects   - starting weight- 285  - current weight - 198  on home scale   - weight loss- 87 lbs   - has been on wegovy for about a year and a half  (since 03/2021)   - previously on mounjaro     diet recall   B- coffee ,smoothie , donut   L- chinese takeout ( half)   D-  steamed veggies , chicken   snacks- nuts  Drinks- water     - exercise - walking     asthma:  - on albuterol , rarely uses. Last used in March when had covid   - will use rescue inhaler during URIs   - worse as a child         Problem list :  Patient Active Problem List   Diagnosis    Mild intermittent asthma without complication (HHS-HCC)    Attention deficit hyperactivity disorder (ADHD), predominantly inattentive type    Overweight (BMI 25.0-29.9)    Other constipation      Past Medical History:   Diagnosis Date    Hypertension     previously prescribed htcz/lisinopril combo pill, blood pressure improved with weight loss. pt occ monitors home BPs  120s/80s    Prediabetes     A1c improved with weight loss     Past Surgical History:   Procedure Laterality Date    APPENDECTOMY          Allergy:  Allergies   Allergen Reactions    Lobster      Nausea and vomiting     Latex Rash        Medications:   Current Outpatient Medications   Medication Instructions    albuterol 90 mcg/actuation inhaler 2 puffs, inhalation, Every 6 hours  PRN    atomoxetine (STRATTERA) 80 mg, oral, Daily, Swallow capsule whole; do not open. If opened accidentally, do not touch eyes; wash hands immediately (product is an eye irritant).    polyethylene glycol (GLYCOLAX) 17 g, oral, Daily PRN    Wegovy 2.4 mg, subcutaneous, Every 7 days            Objective   Objective     Vital Signs  BP 128/82   Pulse 88   Temp 36.3 C (97.4 F) (Oral)   Ht 1.803 m   Wt 91.2 kg   SpO2 100%   BMI 28.03 kg/m     Physical Exam  Vitals reviewed.   Constitutional:       General: He is not in acute distress.     Appearance: Normal appearance.   HENT:  Head: Normocephalic and atraumatic.      Right Ear: Hearing normal.      Left Ear: Hearing normal.      Nose: Nose normal.      Mouth/Throat:      Mouth: Mucous membranes are moist.   Eyes:      General: No scleral icterus.  Cardiovascular:      Rate and Rhythm: Normal rate and regular rhythm.      Heart sounds: Normal heart sounds, S1 normal and S2 normal. No murmur heard.     No friction rub. No gallop.   Pulmonary:      Effort: Pulmonary effort is normal. No respiratory distress.      Breath sounds: Normal breath sounds and air entry. No wheezing, rhonchi or rales.   Musculoskeletal:         General: No swelling.      Right lower leg: No edema.      Left lower leg: No edema.   Skin:     General: Skin is warm and dry.   Neurological:      Mental Status: He is alert.      Gait: Gait normal.   Psychiatric:         Mood and Affect: Mood and affect normal.         Speech: Speech normal.         Behavior: Behavior normal.           Labs:   No results found for: "WBC", "HGB", "HCT", "PLT", "CHOL", "TRIG", "HDL", "LDLCALC", "ALT", "AST", "NA", "K", "CL", "CREATININE", "BUN", "EGFR", "CO2", "TSH", "PSA", "INR", "GLUF", "HGBA1C", "MICROALBUR"       No results found for any previous visit.       Imaging:     Screening:  Depression screening:  Patient Health Questionnaire-2 Score: 0 Interpretation: Negative screening.      Follow-up &  Interventions: Maintain annual screening - No additional Follow-up required    Over the past 2 weeks, how often have you been bothered by any of the following problems?  Little interest or pleasure in doing things: Not at all  Feeling down, depressed, or hopeless: Not at all  Patient Health Questionnaire-2 Score: 0         Anxiety screening:              Assessment/Plan   Asssessment/Plan     Attention deficit hyperactivity disorder (ADHD), predominantly inattentive type  Assessment & Plan:  - on straterra 80mg  daily , tolerating well no side effects  - will discuss with PCP regarding script   - obtain prior records   - consider psych referral   Mild intermittent asthma without complication (HHS-HCC)  Assessment & Plan:  - stable   - continue albuterol inhaler prn   Overweight (BMI 25.0-29.9)  Assessment & Plan:  - on wegovy 2.4mg  /0.33ml weekly   - reviewed lifestyle changes including diet and exercise   - consider [facility] referral       -disucssed reasons to call/rtc/seek medical attention, pt understanding and in agreement      RTC  : establish care PCP 04/2023 as scheduled or sooner as needed

## 2022-11-22 NOTE — Assessment & Plan Note (Signed)
-   on wegovy 2.4mg  /0.54ml weekly   - reviewed lifestyle changes including diet and exercise   - consider [facility] referral

## 2022-11-23 MED ORDER — semaglutide, weight loss, (Wegovy) 2.4 mg/0.75 mL pen injector
2.4 | SUBCUTANEOUS | 2 refills | 28.00000 days | Status: DC
Start: 2022-11-23 — End: 2023-02-20

## 2022-11-23 MED ORDER — albuterol 90 mcg/actuation inhaler
90 | Freq: Four times a day (QID) | RESPIRATORY_TRACT | 1 refills | Status: AC | PRN
Start: 2022-11-23 — End: ?
  Filled 2022-11-23: qty 18, 90d supply, fill #0

## 2022-11-23 MED ORDER — atomoxetine (Strattera) 80 mg capsule
80 | ORAL_CAPSULE | Freq: Every day | ORAL | 0 refills | 30.00000 days | Status: DC
Start: 2022-11-23 — End: 2022-12-22
  Filled 2022-11-23: qty 30, 30d supply, fill #0

## 2022-11-23 NOTE — Progress Notes (Signed)
RXSP MEDICATION ACCESS - PHARMACY BENEFITS CHECK        MEDICATION/THERAPEUTIC CLASS:    Strattera Capsules    Rx COVERAGE (Type):    [employer] (Commercial)    ASSESSMENT:    Is a prior authorization required?: Yes  Co-pay Card Eligibility: N/A    NOTES:    PA is required for Strattera Capsules    Signed  Kandice Hams, CPhT

## 2022-11-23 NOTE — Progress Notes (Signed)
Prior auth team -can we look if prior auth needed for wegovy or straterra. Patient is new to practice and recently established care. Needs med refills    Jonathan Beltran on 11/23/22 at 11:19 AM.

## 2022-11-23 NOTE — Progress Notes (Signed)
RXSP MEDICATION ACCESS - PHARMACY BENEFITS CHECK        MEDICATION/THERAPEUTIC CLASS:    BMWUXL Pen Injectors    Rx COVERAGE (Type):    [employer] (Commercial)    ASSESSMENT:    Is a prior authorization required?: Yes  Co-pay Card Eligibility: Yes - Patient has commercial coverage.    NOTES:    PA required for [facility] Pen Injectors    Signed  Kandice Hams, CPhT

## 2022-11-24 NOTE — Progress Notes (Signed)
RXSP MEDICATION ACCESS - PHARMACY BENEFIT      RX PA approved for wegovy (drug) per [employer] (insurance name, phone #)    Plan type: Commercial    PA effective from 11/24/22(mm/dd/yyyy) to 11/24/23 (mm/dd/yyyy)  PA #:     RX can be filled at any (pharmacy name)  Copay information: $15   Copay card can be applied: Yes.        Signed  Konrad Dolores, PharmD

## 2022-11-24 NOTE — Progress Notes (Signed)
RXSP MEDICATION ACCESS PRIOR AUTHORIZATION SUBMISSION - Decision pending  Date of Submission: 11/24/2022 to [employer]  via PASubmissionMethods: ePA  Please allow at least 24-72 hours for decision updates.     Medication: wegovy 2.4mg   Name, Strength, Form  Request Type: Continuation    Pertinent PMHx and Additional Info Used for Submission:         Signed  Konrad Dolores, PharmD

## 2022-11-28 MED FILL — WEGOVY 2.4 MG/0.75 ML SUBCUTANEOUS PEN INJECTOR: 2.4 2.4 mg/0.75 mL | SUBCUTANEOUS | 28 days supply | Qty: 3 | Fill #0 | Status: CP

## 2022-12-23 MED ORDER — atomoxetine (Strattera) 80 mg capsule
80 | ORAL_CAPSULE | Freq: Every day | ORAL | 0 refills | 30.00000 days | Status: DC
Start: 2022-12-23 — End: 2023-01-23
  Filled 2022-12-23: qty 30, 30d supply, fill #0

## 2022-12-23 MED FILL — WEGOVY 2.4 MG/0.75 ML SUBCUTANEOUS PEN INJECTOR: 2.4 2.4 mg/0.75 mL | SUBCUTANEOUS | 28 days supply | Qty: 3 | Fill #1 | Status: CP

## 2022-12-23 NOTE — Telephone Encounter (Signed)
Assistig covering for PCP while out of office     Pmp reveiwed , dispense report reveiwed   Refill appropriate   Refill sent

## 2023-01-23 MED ORDER — atomoxetine (Strattera) 80 mg capsule
80 | ORAL_CAPSULE | Freq: Every day | ORAL | 0 refills | 30.00000 days | Status: DC
Start: 2023-01-23 — End: 2023-02-20
  Filled 2023-01-23: qty 30, 30d supply, fill #0

## 2023-01-23 MED FILL — WEGOVY 2.4 MG/0.75 ML SUBCUTANEOUS PEN INJECTOR: 2.4 2.4 mg/0.75 mL | SUBCUTANEOUS | 28 days supply | Qty: 3 | Fill #2 | Status: CP

## 2023-02-15 ENCOUNTER — Inpatient Hospital Stay
Admit: 2023-02-15 | Disposition: A | Discharge status: Home / Self Care | Payer: BLUE CROSS/BLUE SHIELD | Primary: Internal Medicine

## 2023-02-15 DIAGNOSIS — A084 Viral intestinal infection, unspecified: Secondary | ICD-10-CM

## 2023-02-15 MED ORDER — ondansetron ODT (Zofran-ODT) disintegrating tablet 8 mg
8 | Freq: Once | ORAL | Status: DC
Start: 2023-02-15 — End: 2023-02-15

## 2023-02-15 MED ORDER — ondansetron ODT (Zofran-ODT) 4 mg disintegrating tablet
4 | ORAL_TABLET | Freq: Three times a day (TID) | ORAL | 0 refills | Status: AC | PRN
Start: 2023-02-15 — End: 2023-02-20

## 2023-02-15 NOTE — Discharge Instructions (Signed)
You were seen for evaluation of nausea and vomiting.  We discussed that symptoms are likely related to viral gastroenteritis.  Symptoms usually improve with supportive measures over a few days.  Recommend eating a bland diet and advancing as tolerated.  Make sure you are drinking plenty of fluids, can drink Pedialyte.  I have sent prescription for Zofran which is an antinausea medication you can take up to 3 times daily as needed for nausea and vomiting.  Discussed that it can cause constipation.  If symptoms persist despite supportive measures, if you are unable to tolerate fluids, if you have any abdominal pain, fevers or any other concerning symptoms, you should go to the emergency department for further evaluation.

## 2023-02-15 NOTE — ED Provider Notes (Signed)
Urgent Care Note  [facility]    Chief Complaint   Patient presents with    Vomiting     Pt presents to UC reporting nausea and vomiting x 3days - onset Sunday. Reporting emesis is undigested food. Reports new loose stools this AM - previously stools were normal. Endorses and cramping. Per wife, although patient has a normally sensitive stomach, the frequency and longevity is different/new.     Nausea       HISTORY OF PRESENT ILLNESS   32 year old male presents to urgent care for evaluation of nausea, vomiting and diarrhea.  Patient reports that his symptoms started on Sunday.  States he went to a wedding over the weekend in [place], flew home and then took a train back to his home.  Reports after he got off the train, he had episode of vomiting.  Reports he then ate pizza, however vomited later that day.  Has been feeling nauseated and vomiting for the past several days.  Tried eating food again last night, however vomited this morning.  Endorses stomach "gurgling."  Reports of 1 episode of diarrhea this morning.  Denies light in the stool.  Denies hematemesis.  No fevers or chills.  No other abdominal pain, urinary symptoms, sore throat, cough, congestion, or any other complaints.  Reports that he has been able to tolerate Pedialyte.      History provided by:  Patient  Language interpreter used: No      PAST MEDICAL HISTORY  Patient Active Problem List   Diagnosis    Mild intermittent asthma without complication (Multi-HCC)    Attention deficit hyperactivity disorder (ADHD), predominantly inattentive type (CMS-HCC)    Overweight (BMI 25.0-29.9)    Other constipation     Past Medical History:   Diagnosis Date    Hypertension (CMS-HCC)     previously prescribed htcz/lisinopril combo pill, blood pressure improved with weight loss. pt occ monitors home BPs  120s/80s    Prediabetes     A1c improved with weight loss     SURGICAL/FAM/SOCIAL HISTORY   Past Surgical History:   Procedure Laterality  Date    APPENDECTOMY         Family History   Problem Relation Name Age of Onset    Hypertension Mother      Hypertension Father      Depression Sister      Anxiety disorder Sister      Autism Brother      ADD / ADHD Brother      Depression Brother      Anxiety disorder Brother      Hypertension Maternal Grandmother      Hypertension Maternal Grandfather      Pneumonia Maternal Grandfather      Hypertension Paternal Grandmother      Diabetes Paternal Grandfather      Hypertension Paternal Grandfather      Stroke Paternal Grandfather         Social History     Tobacco Use    Smoking status: Never    Smokeless tobacco: Never   Substance Use Topics    Alcohol use: Yes     Comment: rare    Drug use: Not on file     MEDICATIONS   Prior to Admission medications    Medication Sig Start Date End Date Taking? Authorizing Provider   albuterol 90 mcg/actuation inhaler Inhale 2 puffs every 6 (six) hours if needed for wheezing. 11/23/22   Katlin [employer],  PA   atomoxetine (Strattera) 80 mg capsule Take 1 capsule (80 mg) by mouth once daily. Swallow capsule whole; do not open. If opened accidentally, do not touch eyes; wash hands immediately (product is an eye irritant). 01/23/23   Wyline Mood, MD   polyethylene glycol (Glycolax) 17 gram/dose powder Take 17 g by mouth if needed each day.    Historical Provider, MD   semaglutide, weight loss, (Wegovy) 2.4 mg/0.75 mL pen injector Inject 2.4 mg under the skin every 7 (seven) days. 11/23/22   [name], PA      Allergies   Allergen Reactions    Lobster      Nausea and vomiting     Latex Rash     REVIEW OF SYSTEMS   Review of Systems   Constitutional:  Negative for chills and fever.   HENT:  Negative for ear pain and sore throat.    Eyes:  Negative for pain and visual disturbance.   Respiratory:  Negative for cough and shortness of breath.    Cardiovascular:  Negative for chest pain and palpitations.   Gastrointestinal:  Positive for diarrhea, nausea and vomiting. Negative for  abdominal pain.   Genitourinary:  Negative for dysuria and hematuria.   Musculoskeletal:  Negative for arthralgias and back pain.   Skin:  Negative for color change and rash.   Neurological:  Negative for seizures and syncope.   All other systems reviewed and are negative.    PHYSICAL EXAM  Visit Vitals  BP 124/73 (BP Location: Left arm, Patient Position: Sitting)   Pulse 102   Temp 36.6 C (97.9 F) (Temporal)   Resp 14      Physical Exam  Vitals and nursing note reviewed.   Constitutional:       General: He is not in acute distress.     Appearance: Normal appearance. He is not toxic-appearing.      Comments: Patient is alert and oriented, in no acute distress.  Resting the stretcher, nontoxic-appearing   HENT:      Head: Normocephalic and atraumatic.      Nose: Nose normal.      Mouth/Throat:      Mouth: Mucous membranes are moist.      Pharynx: Oropharynx is clear.   Eyes:      Conjunctiva/sclera: Conjunctivae normal.   Cardiovascular:      Rate and Rhythm: Normal rate.   Pulmonary:      Effort: Pulmonary effort is normal.   Abdominal:      General: There is no distension.      Tenderness: There is no abdominal tenderness.      Comments: Abdomen is soft, nondistended.  No significant reproducible abdominal tenderness to palpation at this time   Musculoskeletal:         General: No swelling.      Cervical back: Normal range of motion.   Skin:     General: Skin is warm and dry.      Capillary Refill: Capillary refill takes less than 2 seconds.      Findings: No rash.   Neurological:      General: No focal deficit present.      Mental Status: He is alert and oriented to person, place, and time.       TESTING RESULTS/PROCEDURES   Labs Reviewed - No data to display  No orders to display     Procedures  URGENT CARE COURSE/MDM  ED Course & MDM   Medical  Decision Making  32 year old male presents for evaluation of nausea, vomiting and diarrhea.  On arrival, patient is alert and oriented, in no acute distress.  Resting on  stretcher, nontoxic-appearing.  He is hemodynamic stable.  He is afebrile.  Abdomen is soft, nondistended.  Remainder of exam is unrevealing.  Discussed with patient that differential does include a viral gastroenteritis, foodborne illness.  Less consistent with acute intra-abdominal pathology given no abdominal discomfort, no fever.  Discussed limitations of urgent care as we do not do lab work, IV fluids, imaging for these complaints.  He was offered nausea medication, accepted.  I sent a prescription for Zofran 4 mg ODT 3 times daily as needed.  Patient was instructed to continue to stay well-hydrated, get plenty of rest, eat a bland diet and advance as tolerated.  Advised to go to the emergency department with severe worsening symptoms, inability to tolerate fluids, abdominal pain, fevers, or any other concerning symptoms.  Patient verbalized understanding and is in agreement with this plan.  All questions answered.    Problems Addressed:  Viral gastroenteritis: complicated acute illness or injury    Risk  Prescription drug management.      DIAGNOSIS  Problem List Items Addressed This Visit    None  Visit Diagnoses       Viral gastroenteritis    -  Primary    Relevant Medications    ondansetron ODT (Zofran-ODT) 4 mg disintegrating tablet          CONDITION  Fair    DISPOSITION  Discharged    Patient informed of evaluation results. Hospitalization is not necessary as patient is safe for discharge and appropriate for outpatient management. I have answered all questions.  Patient told to inform primary care doctor today of this urgent care visit and to schedule follow-up visit or return if unable to see primary care doctor.  Patient told to seek medical attention immediately for any further concerns, worsening of condition, or not getting better in the expected time thought to. Patient has been discharged.           [place], [place]  02/15/23 [phone]

## 2023-02-20 MED ORDER — semaglutide, weight loss, (Wegovy) 2.4 mg/0.75 mL pen injector
2.4 | SUBCUTANEOUS | 2 refills | 28.00000 days | Status: DC
Start: 2023-02-20 — End: 2023-05-16
  Filled 2023-02-20: qty 3, 28d supply, fill #0

## 2023-02-21 MED ORDER — atomoxetine (Strattera) 80 mg capsule
80 | ORAL_CAPSULE | Freq: Every day | ORAL | 0 refills | 30.00000 days | Status: DC
Start: 2023-02-21 — End: 2023-03-27
  Filled 2023-02-22: qty 30, 30d supply, fill #0

## 2023-03-27 MED ORDER — atomoxetine (Strattera) 80 mg capsule
80 | ORAL_CAPSULE | Freq: Every day | ORAL | 0 refills | 30.00000 days | Status: DC
Start: 2023-03-27 — End: 2023-04-24
  Filled 2023-03-27: qty 30, 30d supply, fill #0

## 2023-03-27 MED FILL — WEGOVY 2.4 MG/0.75 ML SUBCUTANEOUS PEN INJECTOR: 2.4 2.4 mg/0.75 mL | SUBCUTANEOUS | 28 days supply | Qty: 3 | Fill #1 | Status: CP

## 2023-04-24 MED FILL — WEGOVY 2.4 MG/0.75 ML SUBCUTANEOUS PEN INJECTOR: 2.4 2.4 mg/0.75 mL | SUBCUTANEOUS | 28 days supply | Qty: 3 | Fill #2 | Status: CP

## 2023-04-25 MED ORDER — atomoxetine (Strattera) 80 mg capsule
80 | ORAL_CAPSULE | Freq: Every day | ORAL | 0 refills | 30.00000 days | Status: DC
Start: 2023-04-25 — End: 2023-05-16
  Filled 2023-04-25: qty 30, 30d supply, fill #0

## 2023-05-16 ENCOUNTER — Ambulatory Visit
Admit: 2023-05-16 | Discharge: 2023-05-16 | Payer: BLUE CROSS/BLUE SHIELD | Attending: Internal Medicine | Primary: Internal Medicine

## 2023-05-16 DIAGNOSIS — Z23 Encounter for immunization: Secondary | ICD-10-CM

## 2023-05-16 DIAGNOSIS — Z0000 Encounter for general adult medical examination without abnormal findings: Secondary | ICD-10-CM

## 2023-05-16 MED ORDER — atomoxetine (Strattera) 80 mg capsule
80 | ORAL_CAPSULE | Freq: Every day | ORAL | 1 refills | 30.00000 days | Status: AC
Start: 2023-05-16 — End: ?
  Filled 2023-05-18: qty 90, 90d supply, fill #0

## 2023-05-16 MED ORDER — semaglutide, weight loss, (Wegovy) 2.4 mg/0.75 mL pen injector
2.4 | SUBCUTANEOUS | 11 refills | 28.00000 days | Status: AC
Start: 2023-05-16 — End: ?
  Filled 2023-05-16: qty 3, 28d supply, fill #0

## 2023-05-16 NOTE — Progress Notes (Addendum)
 [facility]  [address]  [address] [place] [address]  Dept: [phone]  Dept Fax: [phone]     Patient ID: Jonathan Beltran is a 32 y.o. male who presents for Follow-up.    Subjective       Influenza vaccination completed 02/2023  COVID 19 updated booster completed 02/2023    32 year old male here for annual examination  He reports he is doing well. He has lost with with GLP-1      ADHD: taking Strattera  with benefit. Better concentration. No side effects. Occasional nausea  Obesity to Overweight: currently on wegovy . Weight loss. 84.8 kg now. Doing well. No fhx of MEN syndrome, medullary thyroid cancer. No prior pancreatitis or cholecystitis. He has been on [facility] for 3 years on a diet program. He maintains lifestyle medication including diet and exercise.Jonathan Beltran He has tried lifestyle modification for over 6 months prior to starting Wegovy . He was previously on tirzepatdie perior to Wegovy .  Asthma: has inhalers. No flares  Urinary hesitancy: he reports he noticed takes longer to initiate urine stream this started around 06/2022. 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 He reports no dysuria. No urgency. No incontinence. He does report he had been holding his urine because of busy schedule for hours almost the entire day during his rotation there.  He reports he does not go to bathroom as often as he can because of his work.            Patient Active Problem List   Diagnosis    Mild intermittent asthma without complication (Multi-HCC)    Attention deficit hyperactivity disorder (ADHD), predominantly inattentive type (CMS-HCC)    Overweight (BMI 25.0-29.9)    Other constipation     Current Outpatient Medications   Medication Instructions    albuterol  90 mcg/actuation inhaler 2 puffs, inhalation, Every 6 hours PRN    atomoxetine  (STRATTERA ) 80 mg, oral, Daily    polyethylene glycol (GLYCOLAX) 17 g, oral, Daily PRN    Wegovy  2.4 mg,  subcutaneous, Every 7 days     Allergies   Allergen Reactions    Lobster      Nausea and vomiting     Latex Rash       Objective   Visit Vitals  BP 120/80 (BP Location: Right arm, Patient Position: Sitting, BP Cuff Size: Adult)   Pulse 106   Temp 35.8 C (96.4 F)   Resp 18   Ht 1.8 m   Wt 84.8 kg   SpO2 100%   BMI 26.18 kg/m   BSA 2.06 m       Physical Exam  Vitals as above  GEN: Well appearing  male in NAD, comfortable, pleasant. well dressed, appropriate  HEENT: NCAT, PERRLA, EOMI, neck supple, No cervical or supraclavicular lymphadenopathy  External ear and nose normal. TM clear bilaterally. sclera anicteric, conjunctiva clear no goiter, lesion. moist mucous membranes. good dentition  CHEST: CTA B . good air movement.    COR: RRR, nl S1 and S2, no m.r.g. no heaves or thrills. jvp flat.    ABD: soft nabs, nttp, nd, no organomegaly  EXTREME: no cyanosis, clubbing or edema.    BACK: no CVAT no midline tenderness  SKIN: no rashes. good skin turgor  PULSES: 2+ carotid and 2+ radial and 2+ pop   PSYCH: good eye contact, appropriate, well dressed  NEURO: alert and oriented x 3, CN  2-12 intact bilaterally. motor strength 5/5 in D, B, T,WF,WE,G,I,G,H,Q,HL bilaterally. gait normal.    GU: prostate  not enlarged. No palpable mass or lesions + hemorrhoids. + good rectal tone.    Assessment/Plan   Jonathan Beltran was seen today for follow-up.  Need for hepatitis A vaccination  -     Hepatitis A vaccine adult IM  Attention deficit hyperactivity disorder (ADHD), predominantly inattentive type (CMS-HCC)  -     atomoxetine  (Strattera ) 80 mg capsule; Take 1 capsule (80 mg) by mouth once daily.  Urinary symptom or sign  -     PSA Total, Screen; Future  Annual physical exam  -     Comprehensive metabolic panel; Future  -     Lipid panel; Future  Overweight (BMI 25.0-29.9)  -     semaglutide , weight loss, (Wegovy ) 2.4 mg/0.75 mL pen injector; Inject 2.4 mg under the skin every 7 (seven) days.      32 year old male here for establishment of  care. Jonathan Beltran is doing well. His weight is down. He is maintaining an healthy diet anda lifestyle.     ADHD: controlled  - continue atomoxetine  (Strattera )  - no side effects  - monitor    Obesity:  has lost weight, over 80 lbs. He is doing well.   - BMI 26  - continue wegovy  for now  - consider decrease dose in future   - continue healthy lifestyle changes diet and exercise     Urine hesitancy: could be weakening of bladder muscles from holding urine  - encouraged time urination  - check UA to assess for infection  - check PSA     Preventative  -tdap: he will look into this. 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

## 2023-05-16 NOTE — Other (Signed)
Patient Education  Table of Contents   Hepatitis A Vaccine: What You Need to Know    To view videos and all your education online visit,  https://pe.elsevier.com/1wj1WL0x  or scan this QR code with your smartphone.  Access to this content will expire in one year.  Hepatitis A Vaccine: What You Need to Know  Many vaccine information statements are available in Spanish and other languages. See PromoAge.com.br.  1. Why get vaccinated?  Hepatitis A vaccinecan prevent hepatitis A.  Hepatitis A is a serious liver disease. It is usually spread through close, personal contact with an infected person or when a person unknowingly ingests the virus from objects, food, or drinks that are contaminated by small amounts of stool (poop) from an infected person.  Most adults with hepatitis A have symptoms, including fatigue, low appetite, stomach pain, nausea, and jaundice (yellow skin or eyes, dark urine, light-colored bowel movements). Most children less than 22 years of age do not have symptoms.  A person infected with hepatitis A can transmit the disease to other people even if he or she does not have any symptoms of the disease.   Most people who get hepatitis A feel sick for several weeks, but they usually recover completely and do not have lasting liver damage. In rare cases, hepatitis A can cause liver failure and death; this is more common in people older than 50 years and in people with other liver diseases.  Hepatitis A vaccine has made this disease much less common in the [place]. However, outbreaks of hepatitis A among unvaccinated people still happen.  2. Hepatitis A vaccine  Children need 2 doses of hepatitis A vaccine:   First dose: 12 through 73 months of age    Second dose: at least 6 months after the first dose  Infants 6 through 45 months old traveling outside the [place] when protection against hepatitis A is recommended should receive 1 dose of hepatitis A vaccine. These children should still  get 2 additional doses at the recommended ages for long-lasting protection.  Older children and adolescents 2 through 42 years of age who were not vaccinated previously should be vaccinated.  Adults who were not vaccinated previously and want to be protected against hepatitis A can also get the vaccine.  Hepatitis A vaccine is also recommended for the following people:   International travelers    Men who have sexual contact with other men   People who use injection or non-injection drugs    People who have occupational risk for infection    People who anticipate close contact with an international adoptee    People experiencing homelessness    People with HIV    People with chronic liver disease   In addition, a person who has not previously received hepatitis A vaccine and who has direct contact with someone with hepatitis A should get hepatitis A vaccine as soon as possible and within 2 weeks after exposure.  Hepatitis A vaccine may be given at the same time as other vaccines.  3. Talk with your health care provider  Tell your vaccination provider if the person getting the vaccine:   Has had an allergic reaction after a previous dose of hepatitis A vaccine,or has any severe, life-threatening allergies  In some cases, your health care provider may decide to postpone hepatitis A vaccination until a future visit.  Pregnant or breastfeeding people should be vaccinated if they are at risk for getting hepatitis A. Pregnancy or breastfeeding  are not reasons to avoid hepatitis A vaccination.  People with minor illnesses, such as a cold, may be vaccinated. People who are moderately or severely ill should usually wait until they recover before getting hepatitis A vaccine.  Your health care provider can give you more information.  4. Risks of a vaccine reaction   Soreness or redness where the shot is given, fever, headache, tiredness, or loss of appetite can happen after hepatitis A vaccination.  People sometimes faint  after medical procedures, including vaccination. Tell your provider if you feel dizzy or have vision changes or ringing in the ears.   As with any medicine, there is a very remote chance of a vaccine causing a severe allergic reaction, other serious injury, or death.  5. What if there is a serious problem?  An allergic reaction could occur after the vaccinated person leaves the clinic. If you see signs of a severe allergic reaction (hives, swelling of the face and throat, difficulty breathing, a fast heartbeat, dizziness, or weakness), call 9-1-1 and get the person to the nearest hospital.  For other signs that concern you, call your health care provider.   Adverse reactions should be reported to the Vaccine Adverse Event Reporting System (VAERS). Your health care provider will usually file this report, or you can do it yourself. Visit the VAERS website at www.vaers.LAgents.no or call (209)144-6971. VAERS is only for reporting reactions, and VAERS staff members do not give medical advice.  6. The National Vaccine Injury Compensation Program  The [employer] Vaccine Injury Compensation Program (VICP) is a federal program that was created to compensate people who may have been injured by certain vaccines. Claims regarding alleged injury or death due to vaccination have a time limit for filing, which may be as short as two years. Visit the VICP website at SpiritualWord.at or call 310-017-7176 to learn about the program and about filing a claim.  7. How can I learn more?   Ask your health care provider.   Call your local or state health department.   Visit the website of the Food and Drug Administration (FDA) for vaccine package inserts and additional information at FinderList.no.   Contact the Centers for Disease Control and Prevention (CDC):  ? Call 430-870-9522 (1-800-CDC-INFO) or  ? Visit CDC's website at PicCapture.uy.  Source: CDC Vaccine Information Statement  Hepatitis A Vaccine (02/28/2020)  This same material is available at FootballExhibition.com.br for no charge.  This information is not intended to replace advice given to you by your health care provider. Make sure you discuss any questions you have with your health care provider.  Document Released: 2006-02-24 Document Updated: 2022-08-17 Document Reviewed: 2022-05-19  Elsevier Patient Education ? 2024 Elsevier Inc.

## 2023-05-17 LAB — LIPID PANEL
Cholesterol: 136 mg/dL (ref 100–199)
HDL Cholesterol: 48 mg/dL (ref >39)
LDLc Calc (NIH): 78 mg/dL (ref 0–99)
Non-HDL Chol: 88 mg/dL (ref 0–129)
Triglycerides: 41 mg/dL (ref 0–149)
VLDLc Calc: 10 mg/dL (ref 5–40)

## 2023-05-17 LAB — COMPREHENSIVE METABOLIC PANEL
ALT: 23 IU/L (ref 0–44)
AST: 18 IU/L (ref 0–40)
Albumin: 4.1 g/dL (ref 4.1–5.1)
Alk Phosphatase: 109 IU/L (ref 44–121)
Anion Gap: 14 mmol/L (ref 10.0–18.0)
BUN/Creat Ratio: 7 — ABNORMAL LOW (ref 9–20)
BUN: 8 mg/dL (ref 6–20)
Bili Total: 0.6 mg/dL (ref 0.0–1.2)
Calcium: 9.2 mg/dL (ref 8.7–10.2)
Carbon Dioxide: 24 mmol/L (ref 20–29)
Chloride: 103 mmol/L (ref 96–106)
Creat: 1.14 mg/dL (ref 0.76–1.27)
Globulin Total: 2.5 g/dL (ref 1.5–4.5)
Glucose: 79 mg/dL (ref 70–99)
Potassium: 4.5 mmol/L (ref 3.5–5.2)
Protein Total: 6.6 g/dL (ref 6.0–8.5)
Sodium: 141 mmol/L (ref 134–144)
eGFR: 87 mL/min/{1.73_m2} (ref >59)

## 2023-05-17 LAB — PSA TOTAL, SCREEN: PSA TOTAL: 0.6 ng/mL (ref 0.0–4.0)

## 2023-06-19 NOTE — Telephone Encounter (Signed)
[facility] INTAKE QUESTIONNAIRE    Name:Jonathan Beltran  "[name]" DOB: Jul 07, 1990   Age: 33 y.o.   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 Pt's primary issue is with task initiation and lack of motivation. Denied issues with sleep or appetite. Pt also dealing with impulsive spending/potentially gambling- finds he is gaming a lot and spending money within the games. Pt lives with his wife, 29 y/o daughter, and dog- family is a good support.      Diagnoses/Symptoms: (select all that apply)  ADHD    If ADHD, has diagnostic testing been done by another provider? yes   Provider information: Diagnosed w/ ADHD Inattentive type in Aug 2023 through his PCP at the time. Pt takes Strattera daily to manage sx.    Over the last two weeks, how severe have your/the pt's symptoms been? 4    Any hx of SI/SIB/HI, including hx of violence: no  Current safety risk(s): No current safety risks, feels safe at home, and no access to weapons.     Have you been treated for these problems before? 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 PCP has always managed psych meds.  Any hospitalizations? no    Are you seeking services for SSDI or short term leave? no    Any legal history, past or current charges? (including but not limited to arrests, A&B's, restraining order(s), incarceration, probation, etc.) no    Do you currently use any illicit substances, including but not limited to Marijuana, Cocaine, Heroin/Fentanyl, PCP, etc.) no    Do you have a history of any illicit substance use? no    Do you currently drink alcohol? yes   Please list amount and frequency: 1-2  drinks once or twice per year.     Are you currently receiving Medication Assisted Treatment at another facility? no    Have you been treated for substance use disorder in a hospital, detox or outpatient program, including MAT treatment - Methadone, Suboxone, Vivitrol? No    Type of treatment you are currently seeking: (check all that apply)  Individual Therapy    Do you have a current therapist? no    Does anyone currently prescribe psychiatric medications for you? yes   If yes, name of prescriber: Dr. Marga Hoots- PCP      Narrative: Pt is a 33 year old male seeking individual talk therapy to manage ADHD and impulsive spending. Pt was diagnosed with ADHD in 2023 through his PCP, he reported experiencing sx his whole life before being diagnosed. 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 Pt lives with his wife, 70 y/o daughter, and dog- family is a good support. Denied history of SI/SIB/HI and he has no access to weapons. Denied legal issues and minimal substance use. Pt had last seen a therapist through his med school in 2020, had discontinued once graduating from the program.     Next steps:  Schedule Appointment

## 2023-06-27 MED FILL — WEGOVY 2.4 MG/0.75 ML SUBCUTANEOUS PEN INJECTOR: 2.4 2.4 mg/0.75 mL | SUBCUTANEOUS | 28 days supply | Qty: 3 | Fill #1 | Status: CP

## 2023-07-07 NOTE — Telephone Encounter (Signed)
 [employer] is calling to request a coding review for the [facility] exams on 05/06/2023.    Please call provider line at [phone] for questions.

## 2023-07-07 NOTE — Telephone Encounter (Signed)
 Called [employer] ofto return their call  I do not understand [employer]'s request for coding review  Labs drawn 05/16/2023    No labs drawn on 05/06/2023    Called [employer] to clarify with their listed phone number  Provider services.  Main number,  Placed on hold when the associate answered    Associate report that they are unable to help since they do not know who at [facility] called or the reason or any claim number          [employer] deferred issue  to [facility]  [facility] deferred to [facility]    [employer] report they are  not able help  And says [facility] is responsible for the billing and estimation of charges      Called  [facility]  [phone] [facility]  Control number [id number]    They have annual exam Z00.00  And R39.9 codes    Gave more ICD codes   Asthma J45.20  Overweight E66.3  Constipation  D59.09  Urinary hesitancy R39.11    20-45 days responsible from insurance  Incase they need more to help with coding

## 2023-07-21 MED FILL — WEGOVY 2.4 MG/0.75 ML SUBCUTANEOUS PEN INJECTOR: 2.4 2.4 mg/0.75 mL | SUBCUTANEOUS | 28 days supply | Qty: 3 | Fill #2 | Status: CP

## 2023-07-28 ENCOUNTER — Ambulatory Visit: Admit: 2023-07-28 | Discharge: 2023-07-28 | Payer: BLUE CROSS/BLUE SHIELD | Primary: Internal Medicine

## 2023-07-28 DIAGNOSIS — F900 Attention-deficit hyperactivity disorder, predominantly inattentive type: Secondary | ICD-10-CM

## 2023-07-28 NOTE — Progress Notes (Signed)
 Start Time: 03:02 PM  Stop Time: 03:52 PM    Visit Type: Office Visit    Care Team:  Patient Care Team:  Wyline Mood, MD as PCP - General (Internal Medicine)  Alen Blew, [place] as Physician Assistant (Physician Assistant)  Russella Dar, [facility] as Therapist [facility])  Referral Source: No ref. provider found   Reason for Visit:  Chief Complaint   Patient presents with    ADHD       History from: Client's report and Epic.    HPI:  Jonathan Beltran is a 33 y.o. 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 Pt presents to treatment with primary concern of feeling overwhelmed and unmotivated in his new position. Would like to utilize therapy to get to the root of his feelings and acquire new coping skills. Pt also has concerns over gambling and impulsivity, however, these issues were not covered in depth in today's initial intake due to time constraints; will be subject of follow up session.     Pt reports he was diagnosed with ADHD as an adult. "It was a long time coming." Prior to that, pt always did well in school, however, his mother would need to sit with him and force him to do his homework. Pt was called "smart" by adults, however, struggled with developing work habits. Would tend to do things at the last minute; would need to rush through the assignment; sometimes there would be basic mistakes (i.e. spelling/grammar errors) in the work itself; was difficult to motivate himself to go back and edit his work. Pt reports he internalized a great deal of anxiety and negative self-image prior to being diagnosed with ADHD.     Pt lives with his wife and their three year old daughter. Pt reports being on ADHD medication has addressed a lot of issues in his personal life. Used to be very poorly organized and motivated around doing house hold chores effectively. Now pt can do them with relative ease. Wife has noticed a major positive difference.     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 Spends every day of the week, aside from Thursday, meeting with clients and their parents. Thursdays are dedicated to didactics. His principle cause of concern is completing his notes in a timely and efficient manner. In previous placements, pt has struggled with completing notes; was once over three weeks behind. This was a major problem for the pt and he does not want things to escalate to this point again.     Pt reports his current expectation is to get his notes within a week. Pt estimates he is currently running one week behind where he should be. Last semester, pt reports things were functioning better and he was able to complete his notes timely. This was because pt's mornings were more free and he was able to complete his notes then when he had the most energy. Now pt's schedule has changed to make the mornings virtually unavailable. Pt reports his workload has also intensified. The only time he has to do notes is Friday afternoons after everything in his work week is done. Pt reports feeling little motivation to complete them at this time. If a client or client's parent wants to meet, pt will usually deprioritize his documentation time to take those meetings instead. Pt feels the documentation itself isn't overly burdensome ("I'm fine when I get in the zone"), but the problem is motivating himself to  do it.     Pt's supervisor pointed out that he was falling behind. It was not a punitive conversation, however, it did alarm pt that things were going in a poor direction.     TW and pt reviewed ADHD psycho-education; discussed how his sx may be contributing to his current behavioral difficulties, as well as associated feelings of anxiety and shame around tasks. Brief solution-focused therapy interventions utilized; discussed w/ pt whether it would be possible to discuss this issue with his supervisor (he has been hesitant to bring it up) and advocate for a fixed "protected" time in  his schedule to complete documentation. Pt receptive.         Psychiatric History:  Inpatient Hospitalization:     Inpatient Substance Abuse Treatment:     Emergency Room Visits:     Intensive Outpatient:     Outpatient Substance Abuse Treatment:     Intensive Outpatient:       Past Medical History:  He has a past medical history of Hypertension and Prediabetes.    Past Surgical History:  He has a past surgical history that includes Appendectomy.    Family Psychiatric History:  Psych- family history includes ADD / ADHD in his brother; Anxiety disorder in his brother and sister; Depression in his brother and sister.    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     1 daughter     Social Determinants of Health     Tobacco Use: Low Risk  (05/16/2023)    Patient History     Smoking Tobacco Use: Never     Smokeless Tobacco Use: Never     Passive Exposure: Not on file   Alcohol Use: Not on file   Financial Resource Strain: Low Risk  (07/21/2023)    Overall Financial Resource Strain (CARDIA)     Difficulty of Paying Living Expenses: Not hard at all   Food Insecurity: Unknown (07/21/2023)    Hunger Vital Sign     Worried About Running Out of Food in the Last Year: Never true     Ran Out of Food in the Last Year: Not on file   Transportation Needs: No Transportation Needs (07/21/2023)    PRAPARE - Therapist, art (Medical): No     Lack of Transportation (Non-Medical): No   Physical Activity: Not on file   Stress: Not on file   Social Connections: Not on file   Intimate Partner Violence: Not on file   Depression: Not at risk (11/22/2022)    PHQ-2     PHQ-2 Score: 0   Housing Stability:  Unknown (07/21/2023)    Housing Stability Vital Sign     Unable to Pay for Housing in the Last Year: Not on file     Number of Times Moved in the Last Year: Not on file     Homeless in the Last Year: No   Utilities: Not At Risk (07/21/2023)    Utilities     Threatened with loss  of utilities: No       Substance Abuse History   reports that he does not currently use alcohol.   reports that he does not use drugs.    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 This session was the initial meeting between pt and TW, so emphasis was on gathering information about the presenting problem. Pt has pre-existing dx of ADHD, inattentive. Sx discussed by pt appear consistent with that dx. Pt's current presenting issues appear to be both active sx of ADHD and associated anxiety that pt has internalized around task completion. Pt concerned about gambling use and increased impulsivity; will be assessed at next session. Pt was responsive to BSFT interventions utilized. Would benefit from further CBT to reduce anxiety around task completion and improve impairments in self-image.    Plan:  Assess for gambling addiction.  Build rapport  Collaborate on tx goals  Reduce anxiety around task completion.  -Next appt:  08/24/23.     Assessment/Plan   [name] was seen today for adhd.  Attention deficit hyperactivity disorder (ADHD), predominantly inattentive type

## 2023-08-24 ENCOUNTER — Other Ambulatory Visit: Payer: BLUE CROSS/BLUE SHIELD | Primary: Internal Medicine

## 2023-08-24 ENCOUNTER — Ambulatory Visit: Admit: 2023-08-24 | Discharge: 2023-08-24 | Payer: BLUE CROSS/BLUE SHIELD | Primary: Internal Medicine

## 2023-08-24 DIAGNOSIS — F900 Attention-deficit hyperactivity disorder, predominantly inattentive type: Secondary | ICD-10-CM

## 2023-08-24 NOTE — Progress Notes (Signed)
 Start Time: 05:16 PM  Stop Time: 06:06 PM    Visit Type: Office Visit    Care Team:  Patient Care Team:  Wyline Mood, MD as PCP - General (Internal Medicine)  Alen Blew, [place] as Physician Assistant (Physician Assistant)  Russella Dar, [facility] as Therapist [facility])  Referral Source: No ref. provider found   Reason for Visit:  Chief Complaint   Patient presents with    ADHD       History from: Client's report and Epic.    HPI:  From 07/28/23: Jonathan Beltran is a 33 y.o. 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 Pt presents to treatment with primary concern of feeling overwhelmed and unmotivated in his new position. Would like to utilize therapy to get to the root of his feelings and acquire new coping skills. Pt also has concerns over gambling and impulsivity, however, these issues were not covered in depth in today's initial intake due to time constraints; will be subject of follow up session.      Pt reports he was diagnosed with ADHD as an adult. "It was a long time coming." Prior to that, pt always did well in school, however, his mother would need to sit with him and force him to do his homework. Pt was called "smart" by adults, however, struggled with developing work habits. Would tend to do things at the last minute; would need to rush through the assignment; sometimes there would be basic mistakes (i.e. spelling/grammar errors) in the work itself; was difficult to motivate himself to go back and edit his work. Pt reports he internalized a great deal of anxiety and negative self-image prior to being diagnosed with ADHD.      Pt lives with his wife and their three year old daughter. Pt reports being on ADHD medication has addressed a lot of issues in his personal life. Used to be very poorly organized and motivated around doing house hold chores effectively. Now pt can do them with relative ease. Wife has noticed a major positive difference.      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 Thursdays are dedicated to didactics. His principle cause of concern is completing his notes in a timely and efficient manner. In previous placements, pt has struggled with completing notes; was once over three weeks behind. This was a major problem for the pt and he does not want things to escalate to this point again.      Pt reports his current expectation is to get his notes within a week. Pt estimates he is currently running one week behind where he should be. Last semester, pt reports things were functioning better and he was able to complete his notes timely. This was because pt's mornings were more free and he was able to complete his notes then when he had the most energy. Now pt's schedule has changed to make the mornings virtually unavailable. Pt reports his workload has also intensified. The only time he has to do notes is Friday afternoons after everything in his work week is done. Pt reports feeling little motivation to complete them at this time. If a client or client's parent wants to meet, pt will usually deprioritize his documentation time to take those meetings instead. Pt feels the documentation itself isn't overly burdensome ("I'm fine when I get in the zone"), but  the problem is motivating himself to do it.      Pt's supervisor pointed out that he was falling behind. It was not a punitive conversation, however, it did alarm pt that things were going in a poor direction.        Updates since last session: Pt and TW reviewed last session. Pt reports he did talk to his supervisor about his struggles with documentation. His schedule did change and he was able to nearly catch up. Thinks it will be easier for the next two weeks, however, expects it to get harder when his schedule changes again.    Pt wished to prioritize a gambling issue he had last year, which  caused issues in his marriage. Pt is a Higher education careers adviser; has a game that he liked that a gambling-type feature where pt found he overspent. Pt did not keep track of how often he did this and it became a conflict when his wife noticed the additional charges. Pt stopped the behavior, but hasn't been able to explain why he did it.     Discussed possibility of gambling disorder:     1) Amounts were random and did not increase to achieve desired excitement.  2) Pt reports no restlessness or irritability when he was not gambling  3) Pt was able to stop the behavior successfully when he became cognizant it was a problem  4) Pt did not think of the gambling behavior when he wasn't engaged in it.  5) Pt did do the gambling behavior to cope with periods of boredom and stress  6) The game provided no mechanism for pt to regain his money, so there was never anything to "win back"  7) Pt did not tell his wife about the gambling, but took responsibility when it came up and did not continue to do it secretly afterwards  8) Pt's wife was upset with pt, but it did not jeopardize the relationship and did not result in long-term marital strain. Behavior did not affect any other area of the pt's life  9) Pt never relied on anyone else to pay for gambling.    Pt does not meet criteria for gambling disorder. Discussed whether behavior was better explained as a maladaptive coping mechanism towards stress; pt experienced as a passive pleasure-seeking behavior. Discussed how ADHD sx may have been contributory. Discussed pt making thoughtful, intentional decisions.     Pt and wife have bee giving thought to what area of psychiatry pt wishes to specialize in. There are aspects of inpatient and outpatient that pt both likes and dislikes. MI provided.     Psychiatric History:  Inpatient Hospitalization:     Inpatient Substance Abuse Treatment:     Emergency Room Visits:     Intensive Outpatient:     Outpatient Substance Abuse Treatment:     Intensive  Outpatient:       Past Medical History:  He has a past medical history of Hypertension and Prediabetes.    Past Surgical History:  He has a past surgical history that includes Appendectomy.    Family Psychiatric History:  Psych- family history includes ADD / ADHD in his brother; Anxiety disorder in his brother and sister; Depression in his brother and sister.    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     1 daughter     Social Determinants of Health     Tobacco Use: Low Risk  (05/16/2023)    Patient History     Smoking Tobacco Use: Never     Smokeless Tobacco Use: Never     Passive Exposure: Not on file   Alcohol Use: Not on file   Financial Resource Strain: Low Risk  (07/21/2023)    Overall Financial Resource Strain (CARDIA)     Difficulty of Paying Living Expenses: Not hard at all   Food Insecurity: Unknown (07/21/2023)    Hunger Vital Sign     Worried About Running Out of Food in the Last Year: Never true     Ran Out of Food in the Last Year: Not on file   Transportation Needs: No Transportation Needs (07/21/2023)    PRAPARE - Therapist, art (Medical): No     Lack of Transportation (Non-Medical): No   Physical Activity: Not on file   Stress: Not on file   Social Connections: Not on file   Intimate Partner Violence: Not on file   Depression: Not at risk (11/22/2022)    PHQ-2     PHQ-2 Score: 0   Housing Stability: Unknown (07/21/2023)    Housing Stability Vital Sign     Unable to Pay for Housing in the Last Year: Not on file     Number of Times Moved in the Last Year: Not on file     Homeless in the Last Year: No   Utilities: Not At Risk (07/21/2023)    Utilities      Threatened with loss of utilities: No       Substance Abuse History   reports that he does not currently use alcohol.   reports that he does not use drugs.    Medications:  albuterol, atomoxetine, polyethylene glycol, and semaglutide (weight loss)    Allergies:  Lobster and Latex    Problems:  Patient Active Problem List   Diagnosis    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       Mental Status Evaluation:  Appearance:  age appropriate and well dressed  Behavior:  normal  Speech:  normal pitch and normal volume  Mood:  euthymic  Affect:  normal  Thought Process:  normal  Thought Content:  normal  Sensorium:  person, place, time/date, situation, and day of week  Cognition:  grossly intact  Insight:  Intact  Judgment:  Intact    Impression:  Pt was alert and oriented x 3. Pt continues to meet criteria for ADHD. Pt does not meet criteria for gambling disorder; impulsivity better explained by ADHD sx. Pt was responsive to BSFT and MI interventions utilized. Would benefit from further CBT to reduce anxiety around task completion and improve impairments in self-image.     Plan:  Build rapport  Collaborate on tx goals  Reduce anxiety around task completion.  -Next appt: 09/15/23.     Assessment/Plan   [name] was seen today for adhd.  Attention deficit hyperactivity disorder (ADHD), predominantly inattentive type

## 2023-08-26 MED FILL — ATOMOXETINE 80 MG CAPSULE: 80 80 mg | ORAL | 90 days supply | Qty: 90 | Fill #1 | Status: CP

## 2023-08-26 MED FILL — WEGOVY 2.4 MG/0.75 ML SUBCUTANEOUS PEN INJECTOR: 2.4 2.4 mg/0.75 mL | SUBCUTANEOUS | 28 days supply | Qty: 3 | Fill #3 | Status: CP

## 2023-09-18 ENCOUNTER — Ambulatory Visit: Admit: 2023-09-18 | Discharge: 2023-09-18 | Payer: BLUE CROSS/BLUE SHIELD | Primary: Internal Medicine

## 2023-09-18 DIAGNOSIS — F900 Attention-deficit hyperactivity disorder, predominantly inattentive type: Secondary | ICD-10-CM

## 2023-09-18 NOTE — Progress Notes (Signed)
 Start Time: 04:00 PM  Stop Time: 04:50 PM    Visit Type: Office Visit    Care Team:  Patient Care Team:  [name], MD as PCP - General (Internal Medicine)  [name], [place] as Physician Assistant (Physician Assistant)  [name], [facility] as Therapist [facility])  Referral Source: No ref. provider found   Reason for Visit:  Chief Complaint   Patient presents with    ADHD    Anxiety       History from: Client's report and Epic.    HPI:  From 07/28/23: Jonathan Beltran is a 33 y.o. 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 Pt presents to treatment with primary concern of feeling overwhelmed and unmotivated in his new position. Would like to utilize therapy to get to the root of his feelings and acquire new coping skills. Pt also has concerns over gambling and impulsivity, however, these issues were not covered in depth in today's initial intake due to time constraints; will be subject of follow up session.      Pt reports he was diagnosed with ADHD as an adult. "It was a long time coming." Prior to that, pt always did well in school, however, his mother would need to sit with him and force him to do his homework. Pt was called "smart" by adults, however, struggled with developing work habits. Would tend to do things at the last minute; would need to rush through the assignment; sometimes there would be basic mistakes (i.e. spelling/grammar errors) in the work itself; was difficult to motivate himself to go back and edit his work. Pt reports he internalized a great deal of anxiety and negative self-image prior to being diagnosed with ADHD.      Pt lives with his wife and their three year old daughter. Pt reports being on ADHD medication has addressed a lot of issues in his personal life. Used to be very poorly organized and motivated around doing house hold chores effectively. Now pt can do them with relative ease. Wife has noticed a major positive difference.      Pt decided to pursue psychiatry as a  career. 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 Thursdays are dedicated to didactics. His principle cause of concern is completing his notes in a timely and efficient manner. In previous placements, pt has struggled with completing notes; was once over three weeks behind. This was a major problem for the pt and he does not want things to escalate to this point again.      Pt reports his current expectation is to get his notes within a week. Pt estimates he is currently running one week behind where he should be. Last semester, pt reports things were functioning better and he was able to complete his notes timely. This was because pt's mornings were more free and he was able to complete his notes then when he had the most energy. Now pt's schedule has changed to make the mornings virtually unavailable. Pt reports his workload has also intensified. The only time he has to do notes is Friday afternoons after everything in his work week is done. Pt reports feeling little motivation to complete them at this time. If a client or client's parent wants to meet, pt will usually deprioritize his documentation time to take those meetings instead. Pt feels the documentation itself isn't overly burdensome ("I'm fine when I get  in the zone"), but the problem is motivating himself to do it.      Pt's supervisor pointed out that he was falling behind. It was not a punitive conversation, however, it did alarm pt that things were going in a poor direction.     Updates since last session: Pt and wife just bought a new home in [place] and are in the process of slowly moving. Pt reports the stress so far is manageable.    Pt wished to prioritize discussion of continued difficulties initiating tasks at work, namely his documentation. Pt is currently three weeks behind. Pt describes a pattern where he will do it, think of a way to help the pt  that he wants to do more, and then go do that action instead of the note. Pt knows logically that completing the note will help the pt as well, but it's difficult to see the activity as anything more than a dull chore.     Discussed pt distractibility. Discussed to what degree ADHD sx are contributory.     Provided psycho-education on procrastination and behavioral activation. Discussed decreasing cycle of anxiety/dread towards completing the task.     Psychiatric History:  Inpatient Hospitalization:     Inpatient Substance Abuse Treatment:     Emergency Room Visits:     Intensive Outpatient:     Outpatient Substance Abuse Treatment:     Intensive Outpatient:       Past Medical History:  He has a past medical history of Hypertension and Prediabetes.    Past Surgical History:  He has a past surgical history that includes Appendectomy.    Family Psychiatric History:  Psych- family history includes ADD / ADHD in his brother; Anxiety disorder in his brother and sister; Depression in his brother and sister.    Developmental History:  Social History[1]    Substance Abuse History   reports that he does not currently use alcohol.   reports that he does not use drugs.    Medications:  albuterol , atomoxetine , polyethylene glycol, and semaglutide  (weight loss)    Allergies:  Lobster and Latex    Problems:  Problem List[2]    Mental Status Evaluation:  Appearance:  age appropriate and well dressed  Behavior:  normal  Speech:  normal pitch and normal volume  Mood:  euthymic  Affect:  mood-congruent  Thought Process:  normal  Thought Content:  normal  Sensorium:  person, place, time/date, situation, and day of week  Cognition:  grossly intact  Insight:  Intact  Judgment:  Intact    Impression:  Pt was alert and oriented x 3. Pt continues to meet criteria for ADHD. Pt does not meet criteria for gambling disorder; impulsivity better explained by ADHD sx. Anxiety/avoidance around test completion is prominent. Pt was responsive to  BSFT, ACT, and MI interventions utilized.     Plan:  Build rapport  Decrease avoidant behaviors  Reduce anxiety around task completion.  -Next appt: 10/16/23.     Assessment/Plan   Jonathan Beltran was seen today for adhd and anxiety.  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     Social Determinants of Health     Tobacco Use: Low Risk  (05/16/2023)    Patient History     Smoking Tobacco Use: Never     Smokeless Tobacco Use: Never     Passive Exposure: Not on file   Alcohol Use: Not on file   Financial Resource Strain: Low Risk  (07/21/2023)    Overall Financial Resource Strain (CARDIA)     Difficulty of Paying Living Expenses: Not hard at all   Food Insecurity: Unknown (07/21/2023)    Hunger Vital Sign     Worried About Running Out of Food in the Last Year: Never true     Ran Out of Food in the Last Year: Not on file   Transportation Needs: No Transportation Needs (07/21/2023)    PRAPARE - Therapist, art (Medical): No     Lack of Transportation (Non-Medical): No   Physical Activity: Not on file   Stress: Not on file   Social Connections: Not on file   Intimate Partner Violence: Not on file   Depression: Not at risk (11/22/2022)    PHQ-2     PHQ-2 Score: 0   Housing Stability: Unknown (07/21/2023)    Housing Stability Vital Sign     Unable to Pay for Housing in the Last Year: Not on file     Number of Times Moved in the Last Year: Not on file     Homeless in the Last Year: No   Utilities: Not At Risk (07/21/2023)    Utilities     Threatened with loss of utilities: No   [2]   Patient Active Problem  List  Diagnosis    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

## 2023-10-02 MED FILL — WEGOVY 2.4 MG/0.75 ML SUBCUTANEOUS PEN INJECTOR: 2.4 2.4 mg/0.75 mL | SUBCUTANEOUS | 28 days supply | Qty: 3 | Fill #4 | Status: CP

## 2023-10-16 ENCOUNTER — Ambulatory Visit: Admit: 2023-10-16 | Discharge: 2023-10-16 | Payer: BLUE CROSS/BLUE SHIELD | Primary: Internal Medicine

## 2023-10-16 DIAGNOSIS — F900 Attention-deficit hyperactivity disorder, predominantly inattentive type: Secondary | ICD-10-CM

## 2023-10-16 NOTE — Progress Notes (Addendum)
 Start Time: 04:00 PM  Stop Time: 04:50 PM    Visit Type: Office Visit    Care Team:  Patient Care Team:  [name], MD as PCP - General (Internal Medicine)  [name], [place] as Physician Assistant (Physician Assistant)  [name], [facility] as Therapist [facility])  Referral Source: No ref. provider found   Reason for Visit:  Chief Complaint   Patient presents with    ADHD    Anxiety       History from: Client's report and Epic.    HPI:  From 07/28/23: Jonathan Beltran is a 33 y.o. 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 Pt presents to treatment with primary concern of feeling overwhelmed and unmotivated in his new position. Would like to utilize therapy to get to the root of his feelings and acquire new coping skills. Pt also has concerns over gambling and impulsivity, however, these issues were not covered in depth in today's initial intake due to time constraints; will be subject of follow up session.      Pt reports he was diagnosed with ADHD as an adult. It was a long time coming. Prior to that, pt always did well in school, however, his mother would need to sit with him and force him to do his homework. Pt was called smart by adults, however, struggled with developing work habits. Would tend to do things at the last minute; would need to rush through the assignment; sometimes there would be basic mistakes (i.e. spelling/grammar errors) in the work itself; was difficult to motivate himself to go back and edit his work. Pt reports he internalized a great deal of anxiety and negative self-image prior to being diagnosed with ADHD.      Pt lives with his wife and their three year old daughter. Pt reports being on ADHD medication has addressed a lot of issues in his personal life. Used to be very poorly organized and motivated around doing house hold chores effectively. Now pt can do them with relative ease. Wife has noticed a major positive difference.      Pt decided to pursue psychiatry as a  career. 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 Thursdays are dedicated to didactics. His principle cause of concern is completing his notes in a timely and efficient manner. In previous placements, pt has struggled with completing notes; was once over three weeks behind. This was a major problem for the pt and he does not want things to escalate to this point again.      Pt reports his current expectation is to get his notes within a week. Pt estimates he is currently running one week behind where he should be. Last semester, pt reports things were functioning better and he was able to complete his notes timely. This was because pt's mornings were more free and he was able to complete his notes then when he had the most energy. Now pt's schedule has changed to make the mornings virtually unavailable. Pt reports his workload has also intensified. The only time he has to do notes is Friday afternoons after everything in his work week is done. Pt reports feeling little motivation to complete them at this time. If a client or client's parent wants to meet, pt will usually deprioritize his documentation time to take those meetings instead. Pt feels the documentation itself isn't overly burdensome (I'm fine when I get  in the zone), but the problem is motivating himself to do it.      Pt's supervisor pointed out that he was falling behind. It was not a punitive conversation, however, it did alarm pt that things were going in a poor direction.     Updates since last session: Pt reports he and family have finished with their move. Reports it was a stressful experience; glad it is finished.    Pt still continues to be unmotivated to do notes timely at work. Does endorse that his anticipatory anxiety towards doing notes is much less and he has been successful with forcing himself to do them while minimizing his  distractibility. Pt expressed that he is currently three weeks behind and that his supervisor did give him a friendly warning to catch up.    Discussed what are contributing factors towards pt's current attitudes towards his paperwork. 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    Discussed pt pattern of letting things go until it becomes a problem. Identified how pt might implement work habits to better manage and reduce anxiety.     Psychiatric History:  Inpatient Hospitalization:     Inpatient Substance Abuse Treatment:     Emergency Room Visits:     Intensive Outpatient:     Outpatient Substance Abuse Treatment:     Intensive Outpatient:       Past Medical History:  He has a past medical history of Hypertension and Prediabetes.    Past Surgical History:  He has a past surgical history that includes Appendectomy.    Family Psychiatric History:  Psych- family history includes ADD / ADHD in his brother; Anxiety disorder in his brother and sister; Depression in his brother and sister.    Developmental History:  Social History[1]    Substance Abuse History   reports that he does not currently use alcohol.   reports that he does not use drugs.    Medications:  albuterol , atomoxetine , polyethylene glycol, and semaglutide  (weight loss)    Allergies:  Lobster and Latex    Problems:  Problem List[2]    Mental Status Evaluation:  Appearance:  age appropriate and well dressed  Behavior:  normal  Speech:  normal pitch and normal volume  Mood:  normal  Affect:  normal  Thought Process:  normal  Thought Content:  normal  Sensorium:  person, place, time/date, situation, and day of week  Cognition:  grossly intact  Insight:  Intact  Judgment:  Intact    Impression:  Pt was alert and oriented x 3. Pt continues to meet criteria for ADHD. Pt does not meet criteria for gambling disorder;  impulsivity better explained by ADHD sx. Reduced anticipatory anxiety around tasks. Some continued difficulties with intrinsic motivation and stress management. Pt was responsive to BSFT, ACT, and MI interventions utilized.     Plan:  Decrease avoidant behaviors  Reduce anxiety around task completion.  Decrease stress  -Next appt: 10/30/23.     Assessment/Plan   [name] was seen today for adhd and anxiety.  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     Social Determinants of Health     Tobacco Use: Low Risk  (05/16/2023)    Patient History     Smoking Tobacco Use: Never     Smokeless Tobacco Use: Never     Passive Exposure: Not on file   Alcohol Use: Not on file   Financial Resource Strain: Low Risk  (07/21/2023)    Overall Financial Resource Strain (CARDIA)     Difficulty of Paying Living Expenses: Not hard at all   Food Insecurity: Unknown (07/21/2023)    Hunger Vital Sign     Worried About Running Out of Food in the Last Year: Never true     Ran Out of Food in the Last Year: Not on file   Transportation Needs: No Transportation Needs (07/21/2023)    PRAPARE - Therapist, art (Medical): No     Lack of Transportation (Non-Medical): No   Physical Activity: Not on file   Stress: Not on file   Social Connections: Not on file   Intimate Partner Violence: Not on file   Depression: Not at risk (11/22/2022)    PHQ-2     PHQ-2 Score: 0   Housing Stability: Unknown (07/21/2023)    Housing Stability Vital Sign     Unable to Pay for Housing in the Last Year: Not on file     Number of Times Moved in the  Last Year: Not on file     Homeless in the Last Year: No   Utilities: Not At Risk (07/21/2023)    Utilities     Threatened with loss of utilities: No   [2]   Patient Active Problem List  Diagnosis    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

## 2023-10-30 ENCOUNTER — Ambulatory Visit: Admit: 2023-10-30 | Discharge: 2023-10-30 | Payer: BLUE CROSS/BLUE SHIELD | Primary: Internal Medicine

## 2023-10-30 DIAGNOSIS — F900 Attention-deficit hyperactivity disorder, predominantly inattentive type: Secondary | ICD-10-CM

## 2023-10-30 NOTE — Progress Notes (Signed)
 Start Time: 04:05 PM  Stop Time: 04:55 PM    Visit Type: Office Visit    Care Team:  Patient Care Team:  [name], MD as PCP - General (Internal Medicine)  [name], [place] as Physician Assistant (Physician Assistant)  [name], [facility] as Therapist [facility])  Referral Source: No ref. provider found   Reason for Visit:  Chief Complaint   Patient presents with    ADHD    Anxiety       History from: Client's report and Epic.    HPI:  From 07/28/23: Jonathan Beltran is a 33 y.o. 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 Pt presents to treatment with primary concern of feeling overwhelmed and unmotivated in his new position. Would like to utilize therapy to get to the root of his feelings and acquire new coping skills. Pt also has concerns over gambling and impulsivity, however, these issues were not covered in depth in today's initial intake due to time constraints; will be subject of follow up session.      Pt reports he was diagnosed with ADHD as an adult. It was a long time coming. Prior to that, pt always did well in school, however, his mother would need to sit with him and force him to do his homework. Pt was called smart by adults, however, struggled with developing work habits. Would tend to do things at the last minute; would need to rush through the assignment; sometimes there would be basic mistakes (i.e. spelling/grammar errors) in the work itself; was difficult to motivate himself to go back and edit his work. Pt reports he internalized a great deal of anxiety and negative self-image prior to being diagnosed with ADHD.      Pt lives with his wife and their three year old daughter. Pt reports being on ADHD medication has addressed a lot of issues in his personal life. Used to be very poorly organized and motivated around doing house hold chores effectively. Now pt can do them with relative ease. Wife has noticed a major positive difference.      Pt decided to pursue psychiatry as a  career. 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 Thursdays are dedicated to didactics. His principle cause of concern is completing his notes in a timely and efficient manner. In previous placements, pt has struggled with completing notes; was once over three weeks behind. This was a major problem for the pt and he does not want things to escalate to this point again.      Pt reports his current expectation is to get his notes within a week. Pt estimates he is currently running one week behind where he should be. Last semester, pt reports things were functioning better and he was able to complete his notes timely. This was because pt's mornings were more free and he was able to complete his notes then when he had the most energy. Now pt's schedule has changed to make the mornings virtually unavailable. Pt reports his workload has also intensified. The only time he has to do notes is Friday afternoons after everything in his work week is done. Pt reports feeling little motivation to complete them at this time. If a client or client's parent wants to meet, pt will usually deprioritize his documentation time to take those meetings instead. Pt feels the documentation itself isn't overly burdensome (I'm fine when I get  in the zone), but the problem is motivating himself to do it.      Pt's supervisor pointed out that he was falling behind. It was not a punitive conversation, however, it did alarm pt that things were going in a poor direction.     Updates since last session: Pt reports home life is good. Work is OK as well.    Pt expressed recent anxiety regarding the future. Has one more year in school, then will have my first real job. Pt's colleagues who are finishing this year are talking openly about their future/current plans; pt feels unsettled and avoidant when he hears this initially, although he is able to  talk about it with them. Pt has been in school regularly nearly his entire life; will feel strange to no longer be in school. Discussed pt's specific anxieties and concerns about the future; discussed pt's hesitance towards change; discussed tasks that pt knows he will have to do although he does not enjoy them (resumes, job interviews). Utilized CBT and MI towards anxiety; identified effective behaviors towards anxiety management.     Psychiatric History:  Inpatient Hospitalization:     Inpatient Substance Abuse Treatment:     Emergency Room Visits:     Intensive Outpatient:     Outpatient Substance Abuse Treatment:     Intensive Outpatient:       Past Medical History:  He has a past medical history of Hypertension and Prediabetes.    Past Surgical History:  He has a past surgical history that includes Appendectomy.    Family Psychiatric History:  Psych- family history includes ADD / ADHD in his brother; Anxiety disorder in his brother and sister; Depression in his brother and sister.    Developmental History:  Social History[1]    Substance Abuse History   reports that he does not currently use alcohol.   reports that he does not use drugs.    Medications:  albuterol , atomoxetine , polyethylene glycol, and semaglutide  (weight loss)    Allergies:  Lobster and Latex    Problems:  Problem List[2]    Mental Status Evaluation:  Appearance:  age appropriate and well dressed  Behavior:  normal  Speech:  normal pitch and normal volume  Mood:  euthymic  Affect:  mood-congruent  Thought Process:  normal  Thought Content:  normal  Sensorium:  person, place, time/date, situation, and day of week  Cognition:  grossly intact  Insight:  Intact  Judgment:  Intact    Impression:  Pt was alert and oriented x 3. Pt continues to meet criteria for ADHD.  Reduced anticipatory anxiety around tasks. Some continued difficulties with intrinsic motivation and stress management. The focus of today's session as anticipatory anxieties regarding  the future. Pt was responsive to CBT and MI interventions utilized.     Plan:  Decrease avoidant behaviors  Reduce anxiety around task completion.  Decrease stress  -Next appt: 11/24/23.     Assessment/Plan   [name] was seen today for adhd and anxiety.  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     1 daughter     Social Determinants of Health     Tobacco Use: Low Risk  (05/16/2023)    Patient History     Smoking Tobacco Use: Never     Smokeless Tobacco Use: Never     Passive Exposure: Not on file   Alcohol Use: Not on file   Financial Resource Strain: Low Risk  (07/21/2023)    Overall Financial Resource Strain (CARDIA)     Difficulty of Paying Living Expenses: Not hard at all   Food Insecurity: Unknown (07/21/2023)    Hunger Vital Sign     Worried About Running Out of Food in the Last Year: Never true     Ran Out of Food in the Last Year: Not on file   Transportation Needs: No Transportation Needs (07/21/2023)    PRAPARE - Therapist, art (Medical): No     Lack of Transportation (Non-Medical): No   Physical Activity: Not on file   Stress: Not on file   Social Connections: Not on file   Intimate Partner Violence: Not on file   Depression: Not at risk (11/22/2022)    PHQ-2     PHQ-2 Score: 0   Housing Stability: Unknown (07/21/2023)    Housing Stability Vital Sign     Unable to Pay for Housing in the Last Year: Not on file     Number of Times Moved in the Last Year: Not on file     Homeless in the Last Year: No   Utilities: Not At Risk (07/21/2023)    Utilities     Threatened with loss of utilities: No   [2]    Patient Active Problem List  Diagnosis    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

## 2023-10-31 MED FILL — WEGOVY 2.4 MG/0.75 ML SUBCUTANEOUS PEN INJECTOR: 2.4 2.4 mg/0.75 mL | SUBCUTANEOUS | 28 days supply | Qty: 3 | Fill #5 | Status: CP

## 2023-11-20 ENCOUNTER — Ambulatory Visit: Payer: BLUE CROSS/BLUE SHIELD | Primary: Internal Medicine

## 2023-11-24 ENCOUNTER — Ambulatory Visit: Admit: 2023-11-24 | Discharge: 2023-11-24 | Payer: BLUE CROSS/BLUE SHIELD | Primary: Internal Medicine

## 2023-11-24 DIAGNOSIS — F900 Attention-deficit hyperactivity disorder, predominantly inattentive type: Principal | ICD-10-CM

## 2023-11-24 NOTE — Progress Notes (Signed)
 Start Time: 03:24 PM  Stop Time: 04:14 PM    Visit Type: Office Visit    Care Team:  Patient Care Team:  Laurel Cirri, MD as PCP - General (Internal Medicine)  [name], [place] as Physician Assistant (Physician Assistant)  Curtistine Natter, [facility] as Therapist [facility])  Referral Source: No ref. provider found   Reason for Visit:  Chief Complaint   Patient presents with    ADHD       History from: Client's report and Epic.    HPI:  From 07/28/23: Jonathan Beltran is a 33 y.o. 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 Pt presents to treatment with primary concern of feeling overwhelmed and unmotivated in his new position. Would like to utilize therapy to get to the root of his feelings and acquire new coping skills. Pt also has concerns over gambling and impulsivity, however, these issues were not covered in depth in today's initial intake due to time constraints; will be subject of follow up session.      Pt reports he was diagnosed with ADHD as an adult. It was a long time coming. Prior to that, pt always did well in school, however, his mother would need to sit with him and force him to do his homework. Pt was called smart by adults, however, struggled with developing work habits. Would tend to do things at the last minute; would need to rush through the assignment; sometimes there would be basic mistakes (i.e. spelling/grammar errors) in the work itself; was difficult to motivate himself to go back and edit his work. Pt reports he internalized a great deal of anxiety and negative self-image prior to being diagnosed with ADHD.      Pt lives with his wife and their three year old daughter. Pt reports being on ADHD medication has addressed a lot of issues in his personal life. Used to be very poorly organized and motivated around doing house hold chores effectively. Now pt can do them with relative ease. Wife has noticed a major positive difference.      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 Thursdays are dedicated to didactics. His principle cause of concern is completing his notes in a timely and efficient manner. In previous placements, pt has struggled with completing notes; was once over three weeks behind. This was a major problem for the pt and he does not want things to escalate to this point again.      Pt reports his current expectation is to get his notes within a week. Pt estimates he is currently running one week behind where he should be. Last semester, pt reports things were functioning better and he was able to complete his notes timely. This was because pt's mornings were more free and he was able to complete his notes then when he had the most energy. Now pt's schedule has changed to make the mornings virtually unavailable. Pt reports his workload has also intensified. The only time he has to do notes is Friday afternoons after everything in his work week is done. Pt reports feeling little motivation to complete them at this time. If a client or client's parent wants to meet, pt will usually deprioritize his documentation time to take those meetings instead. Pt feels the documentation itself isn't overly burdensome (I'm fine when I get in the zone), but  the problem is motivating himself to do it.      Pt's supervisor pointed out that he was falling behind. It was not a punitive conversation, however, it did alarm pt that things were going in a poor direction.     Updates since last session: Pt wished to continue prioritizing discussion of anxieties regarding the future. Pt reports current work situation has stabilized; increased administrative time and further concentration of his clinical duties have enabled him to catch up on his notes and maintain better habits. Recently had mildly tense conversation w/ wife regarding pt potentially working for  [employer] after he graduates; wife concerned about long commute from their new home in [place] and how that will effect the pt mentally, which in turn will effect the entire family unit. Pt unsure of how to approach this situation. Discussed pt family dynamic; discussed current and historical challenges; discussed effect of stress on the family unit; identified pt's expectations and anxieties; examined positives/negatives of taking the job vs working somewhere closer to home. MI provided throughout.     Psychiatric History:  Inpatient Hospitalization:     Inpatient Substance Abuse Treatment:     Emergency Room Visits:     Intensive Outpatient:     Outpatient Substance Abuse Treatment:     Intensive Outpatient:       Past Medical History:  He has a past medical history of Hypertension and Prediabetes.    Past Surgical History:  He has a past surgical history that includes Appendectomy.    Family Psychiatric History:  Psych- family history includes ADD / ADHD in his brother; Anxiety disorder in his brother and sister; Depression in his brother and sister.    Developmental History:  Social History[1]    Substance Abuse History   reports that he does not currently use alcohol.   reports that he does not use drugs.    Medications:  albuterol , atomoxetine , polyethylene glycol, and semaglutide  (weight loss)    Allergies:  Lobster and Latex    Problems:  Problem List[2]    Mental Status Evaluation:  Appearance:  age appropriate and well dressed  Behavior:  normal  Speech:  normal pitch and normal volume  Mood:  euthymic  Affect:  normal  Thought Process:  normal  Thought Content:  normal  Sensorium:  person, place, time/date, situation, and day of week  Cognition:  grossly intact  Insight:  Intact  Judgment:  Intact    Impression:  Pt was alert and oriented x 3. Pt continues to meet criteria for ADHD.  Reduced anticipatory anxiety around tasks. Some continued difficulties with intrinsic motivation and stress management. The  focus of today's session as anticipatory anxieties regarding the future. Pt was responsive to CBT and MI interventions utilized.     Plan:  Decrease avoidant behaviors  Reduce anxiety around task completion.  Decrease stress  -Next appt: 12/15/23.     Assessment/Plan   Jonathan Beltran was seen today for adhd.  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     Social Determinants of Health     Tobacco Use: Low Risk  (05/16/2023)    Patient History     Smoking Tobacco Use: Never     Smokeless Tobacco Use: Never     Passive Exposure: Not on file   Alcohol Use: Not on file   Financial Resource Strain: Low Risk  (07/21/2023)    Overall Financial Resource Strain (CARDIA)     Difficulty of Paying Living Expenses: Not hard at all   Food Insecurity: Unknown (07/21/2023)    Hunger Vital Sign     Worried About Running Out of Food in the Last Year: Never true     Ran Out of Food in the Last Year: Not on file   Transportation Needs: No Transportation Needs (07/21/2023)    PRAPARE - Therapist, art (Medical): No     Lack of Transportation (Non-Medical): No   Physical Activity: Not on file   Stress: Not on file   Social Connections: Not on file   Intimate Partner Violence: Not on file   Depression: Not at risk (11/22/2022)    PHQ-2     PHQ-2 Score: 0   Housing Stability: Unknown (07/21/2023)    Housing Stability Vital Sign     Unable to Pay for Housing in the Last Year: Not on file     Number of Times Moved in the Last Year: Not on file     Homeless in the Last Year: No   Utilities: Not At Risk (07/21/2023)    Utilities      Threatened with loss of utilities: No   [2]   Patient Active Problem List  Diagnosis    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

## 2023-11-27 MED ORDER — atomoxetine (Strattera) 80 mg capsule
80 | ORAL_CAPSULE | Freq: Every day | ORAL | 1 refills | 30.00000 days | Status: AC
Start: 2023-11-27 — End: ?
  Filled 2023-11-27: qty 90, 90d supply, fill #0

## 2023-11-27 NOTE — Progress Notes (Signed)
 RXSP MEDICATION ACCESS - PHARMACY BENEFITS CHECK        MEDICATION/THERAPEUTIC CLASS:    WEGOVY  2.4 MG/0.75 ML PEN    Rx COVERAGE (Type):    [employer] (Commercial)    ASSESSMENT:    Is a prior authorization required?: Yes  Co-pay Card Eligibility: Yes - Patient has commercial coverage.    NOTES:    PA has been submitted for WEGOVY  2.4 MG/0.75 ML PEN        Signed  Anton Stallion, CPhT

## 2023-12-07 ENCOUNTER — Other Ambulatory Visit: Payer: BLUE CROSS/BLUE SHIELD | Primary: Internal Medicine

## 2023-12-08 ENCOUNTER — Ambulatory Visit: Payer: BLUE CROSS/BLUE SHIELD | Primary: Internal Medicine

## 2023-12-11 NOTE — Telephone Encounter (Signed)
 Department Name: primary care    Patient: Jonathan Beltran  MRN: [id number]  Agent: Hadassah Provost    Clinical Access Center Scheduling Message    Patient requesting call back from: clinical staff    In regards to: status of PA on wegovy - patient has now been out of medication for 2 weeks    Best call back number: [phone]

## 2023-12-14 NOTE — Progress Notes (Signed)
 RXSP MEDICATION ACCESS - PHARMACY BENEFIT      RX PA approved for Wegovy  2.4 mg (drug) per [place] [employer] name, phone #)    Plan type: Commercial    PA effective from 12/18/23 (mm/dd/yyyy) to 12/17/24  (mm/dd/yyyy)  PA #: EOC ID: [id number]     RX can be filled at [facility]  (pharmacy name)  Copay information: $0   Copay card can be applied: Yes.      approved letter is scanned into patient's medical record.      Signed  Esme Freund, CPhT

## 2023-12-14 NOTE — Progress Notes (Unsigned)
 MEDICATION/THERAPEUTIC CLASS:    Wegovy  2.4 mg     Rx COVERAGE (Type):     (Commercial)    ASSESSMENT:    Is a prior authorization required?: Yes  Co-pay Card Eligibility: Yes - Patient has commercial coverage.    NOTES:    PA has been submitted via Latent on 12/14/23.  EOC ID: [id number]       Signed  Vivi Piccirilli, CPhT

## 2023-12-15 ENCOUNTER — Other Ambulatory Visit: Payer: BLUE CROSS/BLUE SHIELD | Primary: Internal Medicine

## 2023-12-15 NOTE — Telephone Encounter (Signed)
 Hi, called [employer] # [phone] and told PA was submitted on 12/14/23 and said still in review status and will take 24-72 hours.

## 2023-12-18 NOTE — Telephone Encounter (Addendum)
 Called [employer]  They reported they have not make determination yet    I let [employer] know that patient has been off medication for 3 weeks    They will escalate determination to PA department now  Laurel Cirri, MD  12/18/2023  1340

## 2023-12-19 MED FILL — WEGOVY 2.4 MG/0.75 ML SUBCUTANEOUS PEN INJECTOR: 2.4 2.4 mg/0.75 mL | SUBCUTANEOUS | 28 days supply | Qty: 3 | Fill #6 | Status: CP

## 2023-12-29 ENCOUNTER — Other Ambulatory Visit: Admit: 2023-12-29 | Discharge: 2023-12-29 | Payer: BLUE CROSS/BLUE SHIELD | Primary: Internal Medicine

## 2023-12-29 DIAGNOSIS — F900 Attention-deficit hyperactivity disorder, predominantly inattentive type: Principal | ICD-10-CM

## 2023-12-29 NOTE — Progress Notes (Signed)
 [facility]  [address]  [address]  [place] [address]  Dept: [phone]  Dept Fax: [phone]    Visit Type: Follow Up      Patient: Jonathan Beltran Gender:  male    Referred by:  No ref. provider found DOB:  April 07, 1991   Accompanied by:  no one, alone. Age:  33 y.o.     Reason for Visit:   Jonathan Beltran presents today for follow up care of  ADHD.     Subjective/Objective:  Interval history/mental status/collateral info: Pt reports falling behind in his notes again, this time about six weeks behind. Pt was just recently able to catch up; his family was away for the week and pt used the extra time to himself to finish his notes. Pt reported this was moderately stressful; acknowledges that he can't keep going with this behavior pattern. Pt also expressed that the experience of actually doing the notes wasn't as bad as it was in his head.     Discussed pt strategies towards avoiding future recurrences of the same behavior. Pt expressed that he benefits from routines and from visual reminder cues. Discussed setting a particular time 2-3 days a week that he would dedicate to working on notes. Discussed strategies for pt integrating visual cues into his work life. Made a goal of pt not allowing himself to fall behind longer than one week.     Medications  Current Outpatient Medications   Medication Instructions    albuterol  90 mcg/actuation inhaler 2 puffs, inhalation, Every 6 hours PRN    atomoxetine  (STRATTERA ) 80 mg, oral, Daily    polyethylene glycol (GLYCOLAX) 17 g, oral, Daily PRN    Wegovy  2.4 mg, subcutaneous, Every 7 days     Allergies  Lobster and Latex    Mental Status Evaluation  Cognition:   grossly intact  Sensorium:   person, place, time/date, situation, and day of week  Appearance:   age appropriate and well dressed  Behavior:   normal  Speech:   normal pitch and normal volume  Mood:    euthymic  Affect:    normal  Thought Process:   normal  Thought Content:  normal  Insight:    Intact  Judgment:   Intact    Assessment/Plan   Diagnosis:    1. Attention deficit hyperactivity disorder (ADHD), predominantly inattentive type       Assessment:  Pt continues to meet criteria for ADHD. Inattentive sx consistent with dx. Sx exacerbated by low motivation, anticipatory anxiety, and cycles of avoidance. Noted improvement in reducing anticipatory anxiety compared to previous sessions. Pt demonstrates insight and motivation to improve coping ability.     Plan:   Psychotherapy every 2-4 weeks. Reduce vocational and social impairment due to ADHD sx. Reduce anticipatory anxiety. Improve task completion.       Safety Assessment:  Regarding safety, the patient has had thoughts of harm to self or others?   No . The patient has risk factors that include n/a, n/a. Protective factors include access to psychiatric care , ongoing psychiatric care , strong community supports, future orientation, and help-seeking behavior .  In considering these factors, the patient does not seem an acute risk for intentional harm to self or others.    Disposition/Follow Up: 01/26/24  Follow up in about 4 weeks (around 01/26/2024).    Total time spent in face-to-face psychotherapy:  50 minutes    Will communicate plan with Care  Team including primary care.     Electronic signature:  Curtistine Natter, [facility]  [facility]  Outpatient Psychiatry     This real-time, interactive virtual Telehealth encounter was done by video with the patient's verbal consent. Two patient identifiers were used and confirmed. Physical location of other to the patient was located in [place] at the time of the visit.  Physical location of the provider: office. Other participants/involvement: none

## 2024-01-02 NOTE — Behavioral Health Treatment Team (Signed)
 OUTPATIENT TREATMENT PLAN  [address]  [facility]  [address] [place] [address]      Patient:  Jonathan Beltran  DOB:  12-21-1990    Providers on Multidisciplinary Team:   Patient Care Team:  Laurel Cirri, MD as PCP - General (Internal Medicine)  [name], [place] as Physician Assistant (Physician Assistant)  Curtistine Natter, [facility] as Therapist (Behavioral Health)    Diagnosis:   1. Attention deficit hyperactivity disorder (ADHD), predominantly inattentive type         Current Medications:   Current Outpatient Medications   Medication Instructions    albuterol  90 mcg/actuation inhaler 2 puffs, inhalation, Every 6 hours PRN    atomoxetine  (STRATTERA ) 80 mg, oral, Daily    polyethylene glycol (GLYCOLAX) 17 g, oral, Daily PRN    Wegovy  2.4 mg, subcutaneous, Every 7 days       Patient's SNAP Assessment:    Strengths:  Supportive family (parents, siblings, etc), Supportive spouse or significant other, Financial Stability, Stable Housing, Ability to [employer] with others, Ability to [employer] relationships, Motivation for Treatment, Future Oriented & Goal-Directed, and Positively engaged in school/academics   Needs:  Psychoeducation, Impulse Management Skills , Improvement in communication/interpersonal skills, and Organizational skills   Abilities: Motivated for Treatment, Ability to Learn, Ability to Work, Ability to [employer] Write , Engineer, drilling) Knowledge & Skills , Manufacturing systems engineer , Interpersonal Skills , Positive Self-Esteem, Positive Goals and Plans for Future , Ask for Help , and Frustration Tolerance   Preferences: n/a     Treatment Goals:    (Use SMART Goals: Specific, Measurable, Attainable, Relevant, Time bound)        Goal 1:  Reduce impact of ADHD sx on vocational and interpersonal functioning.   Target Date (within 6 months): 06/28/24  Intervention    Type:  Individual  Therapy     Frequency:  Every 2 to 4  weeks    Duration: 50 minutes  Responsible Clinician: Curtistine Natter, [facility]     Goal 2:   Target Date (within 6 months): Decrease avoidant behavior around tasks to degree that there is no significant negative impact on pt's work performance and home life.   Intervention    Type:  Individual  Therapy     Frequency:  Every 2 to 4 weeks    Duration: 50 minutes  Responsible Clinician: Curtistine Natter, [facility]     Goal 3: Reduce pt anxiety around task completion.   Target Date (within 6 months):  06/28/24   Intervention    Type:  Individual  Therapy     Frequency:  Every 2 to 4 weeks    Duration: 50 minutes  Responsible Clinician: Curtistine Natter, [facility]    Treatment Comments:  Pt also struggles with stage of life stressors; reducing ambivalence and anxiety will be an additional focus of anxiety as needed.     Attestation  I certify that the patient and members of the treatment team participated in the formulation of this treatment plan. I certify that the treatment plan is medically necessary and has been prescribed by me for the treatment of the identified problems, to improve or maintain functioning, and to prevent relapse and hospitalization.

## 2024-01-11 MED FILL — WEGOVY 2.4 MG/0.75 ML SUBCUTANEOUS PEN INJECTOR: 2.4 2.4 mg/0.75 mL | SUBCUTANEOUS | 28 days supply | Qty: 3 | Fill #7 | Status: CP

## 2024-01-26 ENCOUNTER — Ambulatory Visit: Admit: 2024-01-26 | Discharge: 2024-01-26 | Payer: BLUE CROSS/BLUE SHIELD | Primary: Internal Medicine

## 2024-01-26 DIAGNOSIS — F900 Attention-deficit hyperactivity disorder, predominantly inattentive type: Principal | ICD-10-CM

## 2024-01-26 NOTE — Progress Notes (Signed)
 [facility]  [address]  [address]  [place] [address]  Dept: [phone]  Dept Fax: [phone]    Visit Type: Follow Up      Patient: Jonathan Beltran Gender:  male    Referred by:  No ref. provider found DOB:  10-22-1990   Accompanied by:  no one, alone. Age:  33 y.o.     Reason for Visit:   Jonathan Beltran presents today for follow up care of  ADHD.    Subjective/Objective:  Interval history/mental status/collateral info: Pt reports improvements in his ability to keep up with his workload. 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 Pt has begun to fall behind again by about a week.     Discussed what has worked so far with his new strategies (routine); identified a temporary new routine to help mitigate present issues (scheduling less on Fridays to complete notes). Provided CBT towards perfectionist thoughts.     Pt reports visual reminder cues were not initiated after last session. Pt will attempt again and report efficacy.    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 Explored assumptions of pt anxiety; challenged assumptions; utilized cognitive restructuring; identified effective behaviors.    CBT, MI, and behavioral activation interventions utilized throughout.     Pt presents with euthymic affect and congruent mood. Eye contact consistent, grooming and hygiene appropriate. Speech is normal in rate and tone. Thought process is logical.     Medications  Current Outpatient Medications   Medication Instructions    albuterol  90 mcg/actuation inhaler 2 puffs, inhalation, Every 6 hours PRN    atomoxetine  (STRATTERA ) 80 mg, oral, Daily    polyethylene glycol (GLYCOLAX) 17 g, oral, Daily PRN    Wegovy  2.4 mg, subcutaneous, Every 7 days      Allergies  Lobster and Latex    Mental Status Evaluation  Cognition:   grossly intact  Sensorium:   person, place, time/date, situation, and day of week  Appearance:   age appropriate and well dressed  Behavior:   normal  Speech:   normal pitch and normal volume  Mood:    anxious  Affect:    mood-congruent  Thought Process:  normal  Thought Content:  normal  Insight:    Intact  Judgment:   Intact    Assessment/Plan   Diagnosis:    1. Attention deficit hyperactivity disorder (ADHD), predominantly inattentive type       Assessment:  Pt continues to meet criteria for ADHD. Inattentive sx consistent with dx. Sx exacerbated by low motivation, anticipatory anxiety, and cycles of avoidance. Noted improvement in initiating tasks compared to previous sessions. Focus of today's session was to reinforce positive habits, problem solve, prevent relapse, and explore related social anxieties. Pt demonstrates insight and motivation to improve coping ability.   Evaluation of the patients ability and capacity to respond to treatment: Engaged and help seeking.    Plan:    Psychotherapy every 2-4 weeks. Reduce vocational and social impairment due to ADHD sx. Reduce anticipatory anxiety. Improve task completion.       Safety Assessment:  Regarding safety, the patient has had thoughts of harm to self or others?   No . The patient has risk factors that include n/a, anxiety.  Protective factors include access to psychiatric care , ongoing psychiatric care , future orientation, and help-seeking behavior .  In considering these factors, the patient does not seem an acute risk for intentional harm to self or others.    Disposition/Follow Up: 02/23/24  Follow up in about 4 weeks (around 02/23/2024).    Total time spent in face-to-face psychotherapy:  50 minutes    Will communicate plan with Care Team including primary care.     Electronic signature:  Curtistine Natter, [facility]  [facility]  Outpatient Psychiatry

## 2024-02-08 MED FILL — WEGOVY 2.4 MG/0.75 ML SUBCUTANEOUS PEN INJECTOR: 2.4 2.4 mg/0.75 mL | SUBCUTANEOUS | 28 days supply | Qty: 3 | Fill #8 | Status: CP

## 2024-02-23 ENCOUNTER — Ambulatory Visit: Admit: 2024-02-23 | Discharge: 2024-02-23 | Payer: BLUE CROSS/BLUE SHIELD | Primary: Internal Medicine

## 2024-02-23 DIAGNOSIS — F900 Attention-deficit hyperactivity disorder, predominantly inattentive type: Principal | ICD-10-CM

## 2024-02-23 NOTE — Progress Notes (Signed)
 [facility]  [address]  [address]  [place] [address]  Dept: [phone]  Dept Fax: [phone]    Visit Type: Follow Up      Patient: Jonathan Beltran Gender:  male    Referred by:  No ref. provider found DOB:  [birth year]   Accompanied by:  no one, alone. Age:  33 y.o.     Reason for Visit:   Jonathan Beltran presents today for follow up care of  ADHD.     Subjective/Objective:  Interval history/mental status/collateral info: Pt reports being behind in his workload by about a month.     Pt expressed that his supervisor did speak to him; was able to be helpful and problem solve. Pt will be switching to AI notes at some point in the future, which should help.    Discussed particular stuck points about doing his notes that cause pt anxiety. Pt expressed the writing for different audiences aspect of what to include/how to praise things causes the biggest issues. 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     CBT and MI utilized throughout.    Pt presents with euthymic affect and congruent mood. Eye contact consistent, grooming and hygiene appropriate. Speech is normal in rate and tone. Thought process is logical.     Medications  Current Outpatient Medications   Medication Instructions    albuterol  90 mcg/actuation inhaler 2 puffs, inhalation, Every 6 hours PRN    atomoxetine  (STRATTERA ) 80 mg, oral, Daily    polyethylene glycol (GLYCOLAX) 17 g, oral, Daily PRN    Wegovy  2.4 mg, subcutaneous, Every 7 days     Allergies  Lobster and Latex    Mental Status Evaluation  Cognition:   grossly intact  Sensorium:   person, place, time/date, situation, and day of week  Appearance:   age appropriate and well dressed  Behavior:   normal  Speech:   normal pitch and normal  volume  Mood:    euthymic  Affect:    mood-congruent  Thought Process:  normal  Thought Content:  normal  Insight:    Intact  Judgment:   Intact    Assessment/Plan   Diagnosis:    1. Attention deficit hyperactivity disorder (ADHD), predominantly inattentive type      Assessment:  Pt continues to meet criteria for ADHD. Inattentive sx consistent with dx. Sx exacerbated by low motivation, anticipatory anxiety, and cycles of avoidance. Noted improvement in initiating tasks compared to previous sessions. Focus of today's session was to address relapse into previous pattern of avoidant behavior and addressing specific anxieties around tasks. Pt demonstrates insight and motivation to improve coping ability.   Evaluation of the patients ability and capacity to respond to treatment: Engaged and help seeking.    Plan:    Psychotherapy every 2-4 weeks. Reduce vocational and social impairment due to ADHD sx. Reduce anticipatory anxiety. Improve task completion.           Safety Assessment:  Regarding safety, the patient has had thoughts of harm to self or others?   No . The patient has risk factors that include n/a, anxiety. Protective factors include access to psychiatric care , ongoing psychiatric care , future orientation, and help-seeking behavior .  In considering these factors, the patient does not seem an acute risk for intentional  harm to self or others.    Disposition/Follow Up: 03/22/2024  Follow up in about 4 weeks (around 03/22/2024).    Total time spent in face-to-face psychotherapy:  45 minutes    Will communicate plan with Care Team including primary care.     Electronic signature:  Jonathan Beltran, [facility]  [facility]  Outpatient Psychiatry

## 2024-03-10 MED FILL — ATOMOXETINE 80 MG CAPSULE: 80 80 mg | ORAL | 90 days supply | Qty: 90 | Fill #1 | Status: CP

## 2024-03-10 MED FILL — WEGOVY 2.4 MG/0.75 ML SUBCUTANEOUS PEN INJECTOR: 2.4 2.4 mg/0.75 mL | SUBCUTANEOUS | 28 days supply | Qty: 3 | Fill #9 | Status: CP

## 2024-03-22 ENCOUNTER — Ambulatory Visit: Admit: 2024-03-22 | Discharge: 2024-03-22 | Payer: BLUE CROSS/BLUE SHIELD | Primary: Internal Medicine

## 2024-03-22 DIAGNOSIS — F900 Attention-deficit hyperactivity disorder, predominantly inattentive type: Principal | ICD-10-CM

## 2024-03-22 NOTE — Progress Notes (Addendum)
 [facility]  [address]  [address]  [place] [address]  Dept: [phone]  Dept Fax: [phone]    Visit Type: Follow Up      Patient: Jonathan Beltran Gender:  male    Referred by:  No ref. provider found DOB:  June 20, 1990   Accompanied by:  no one, alone. Age:  33 y.o.     Reason for Visit:   Earnest Mcgillis presents today for follow up care of  ADHD.     Subjective/Objective:  Interval history/mental status/collateral info: Pt reports ADHD sx are stable; workload issues are not a topic of concern at the moment.    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 Discussed two particular cases that were bothering the pt. In both cases, the pt's main concern was the inability of parenting and social systems to function correctly. 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 Worried that, when he moves on from his position in six months, these pts may fall through the cracks. Discussed what pt can meaningful do towards these anxieties; discussed the limitations of his present role; discussed moral injury and the impact on the pt.    CBT and MI utilized throughout.     Pt presents with euthymic affect and congruent mood. Eye contact consistent, grooming and hygiene appropriate. Speech is normal in rate and tone. Thought process is logical.     Medications  Current Outpatient Medications   Medication Instructions    albuterol  90 mcg/actuation inhaler 2 puffs, inhalation, Every 6 hours PRN    atomoxetine  (STRATTERA ) 80 mg, oral, Daily    polyethylene glycol (GLYCOLAX) 17 g, oral, Daily PRN    Wegovy  2.4 mg, subcutaneous, Every 7 days      Allergies  Lobster and Latex    Mental Status Evaluation  Cognition:   grossly intact  Sensorium:   person, place, time/date, situation, and day of week  Appearance:   age appropriate and well dressed  Behavior:   normal  Speech:   normal pitch and normal volume  Mood:    euthymic and sad  Affect:    mood-congruent  Thought Process:  normal  Thought Content:  normal  Insight:    Intact  Judgment:   Intact    Assessment/Plan   Diagnosis:    1. Attention deficit hyperactivity disorder (ADHD), predominantly inattentive type      Assessment:  Pt continues to meet criteria for ADHD. Inattentive sx consistent with dx. Sx exacerbated by low motivation, anticipatory anxiety, and cycles of avoidance. Maintains improvement in initiating tasks. Focus of today's session was to review current ADHD sx and to discuss impact of pt's current vocational stressors on his mental health. Pt demonstrates insight and motivation to improve coping ability.   Evaluation of the patients ability and capacity to respond to treatment: Engaged and help seeking.    Plan:    Psychotherapy every 2-4 weeks. Reduce vocational and social impairment due to ADHD sx. Reduce anticipatory anxiety. Improve task completion.       Safety Assessment:  Regarding safety, the patient has had thoughts of harm to self or others?   No . The patient  has risk factors that include n/a, anxiety. Protective factors include access to psychiatric care , ongoing psychiatric care , future orientation, and help-seeking behavior .  In considering these factors, the patient does not seem an acute risk for intentional harm to self or others.    Disposition/Follow Up: 04/24/2024  Follow up in about 1 month (around 04/24/2024).    Total time spent in face-to-face psychotherapy:  50 minutes    Will communicate plan with Care Team including primary care.     Electronic signature:  Curtistine Natter, [facility]  [facility]  Outpatient Psychiatry

## 2024-04-08 MED FILL — WEGOVY 2.4 MG/0.75 ML SUBCUTANEOUS PEN INJECTOR: 2.4 2.4 mg/0.75 mL | SUBCUTANEOUS | 28 days supply | Qty: 3 | Fill #10 | Status: CP

## 2024-04-24 ENCOUNTER — Ambulatory Visit: Admit: 2024-04-24 | Discharge: 2024-04-25 | Payer: BLUE CROSS/BLUE SHIELD | Primary: Internal Medicine

## 2024-04-24 DIAGNOSIS — F900 Attention-deficit hyperactivity disorder, predominantly inattentive type: Principal | ICD-10-CM

## 2024-04-24 NOTE — Progress Notes (Signed)
 [facility]  [address]  [address]  [place] [address]  Dept: [phone]  Dept Fax: [phone]    Visit Type: Follow Up      Patient: Jonathan Beltran Gender:  male    Referred by:  No ref. provider found DOB:  July 05, 1990   Accompanied by:  no one, alone. Age:  33 y.o.     Reason for Visit:   Jonathan Beltran presents today for follow up care of  ADHD.     Subjective/Objective:  Interval history/mental status/collateral info: Pt reports ADHD sx are stable. Feels he has attained a stable routine regarding completing his tasks; is currently a week behind due to mitigating factors, but feels confident that he can catch up.     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    Examined signs and sx of burn out; discussed pt self-care; discussed moral injury and its impact on the pt; discussed pt's feelings about the inadequacy of current social systems to meet the needs of individuals on his case load. Discussed stress management.    CBT and MI utilized throughout.     Pt presents with euthymic affect and congruent mood. Eye contact consistent, grooming and hygiene appropriate. Speech is normal in rate and tone. Thought process is logical.     Medications  Current Outpatient Medications   Medication Instructions    albuterol  90 mcg/actuation inhaler 2 puffs, inhalation, Every 6 hours PRN    atomoxetine  (STRATTERA ) 80 mg, oral, Daily    polyethylene glycol (GLYCOLAX) 17 g, oral, Daily PRN    Wegovy  2.4 mg, subcutaneous, Every 7 days     Allergies  Lobster and Latex    Mental Status Evaluation  Cognition:   grossly  intact  Sensorium:   person, place, time/date, situation, and day of week  Appearance:   age appropriate and well dressed  Behavior:   normal  Speech:   normal pitch and normal volume  Mood:    euthymic  Affect:    mood-congruent  Thought Process:  normal  Thought Content:  normal  Insight:    Intact  Judgment:   Intact    Assessment/Plan   Diagnosis:    1. Attention deficit hyperactivity disorder (ADHD), predominantly inattentive type    2. Stress at work      Assessment:  Pt continues to meet criteria for ADHD. Inattentive sx consistent with dx. Sx exacerbated by low motivation, anticipatory anxiety, and cycles of avoidance. Maintains improvement in initiating tasks. Focus of today's session was to review current ADHD sx and to discuss impact of pt's current vocational stressors on his mental health. Pt demonstrates insight and motivation to improve coping ability.   Evaluation of the patients ability and capacity to respond to treatment: Engaged and help seeking    Plan:  Psychotherapy every 2-4 weeks. Reduce vocational and social impairment due to ADHD sx. Reduce anticipatory anxiety. Maintain improved task completion. Manage vocational stressors.      Safety Assessment:  Regarding safety, the patient has had thoughts of harm to self or others?   No . The patient has risk factors that  include n/a, anxiety. Protective factors include access to psychiatric care , ongoing psychiatric care , strong community supports, future orientation, and help-seeking behavior .  In considering these factors, the patient does not seem an acute risk for intentional harm to self or others.    Disposition/Follow Up: 05/24/2024  Follow up in about 30 days (around 05/24/2024).    Total time spent in face-to-face psychotherapy:  45 minutes    Will communicate plan with Care Team including primary care.     Electronic signature:  Curtistine Natter, [facility]  [facility]  Outpatient Psychiatry

## 2024-05-02 MED FILL — WEGOVY 2.4 MG/0.75 ML SUBCUTANEOUS PEN INJECTOR: 2.4 2.4 mg/0.75 mL | SUBCUTANEOUS | 28 days supply | Qty: 3 | Fill #11 | Status: CN

## 2024-05-14 MED FILL — WEGOVY 2.4 MG/0.75 ML SUBCUTANEOUS PEN INJECTOR: 2.4 2.4 mg/0.75 mL | SUBCUTANEOUS | 28 days supply | Qty: 3 | Fill #12 | Status: CP

## 2024-05-20 ENCOUNTER — Ambulatory Visit
Admit: 2024-05-20 | Discharge: 2024-05-20 | Payer: BLUE CROSS/BLUE SHIELD | Attending: Internal Medicine | Primary: Internal Medicine

## 2024-05-20 DIAGNOSIS — Z0000 Encounter for general adult medical examination without abnormal findings: Principal | ICD-10-CM

## 2024-05-20 MED ORDER — WEGOVY 2.4 MG/0.75 ML SUBCUTANEOUS PEN INJECTOR
2.4 | SUBCUTANEOUS | 11 refills | 28.00000 days | Status: AC
Start: 2024-05-20 — End: ?
  Filled 2024-06-06: qty 3, 28d supply, fill #0

## 2024-05-20 MED ORDER — ATOMOXETINE 80 MG CAPSULE
80 | ORAL_CAPSULE | Freq: Every day | ORAL | 1 refills | 60.00000 days | Status: AC
Start: 2024-05-20 — End: ?
  Filled 2024-05-20 – 2024-06-06 (×2): qty 90, 90d supply, fill #0

## 2024-05-20 NOTE — Progress Notes (Signed)
 [facility]  [address]  [address] [place] [address]  Dept: [phone]  Dept Fax: [phone]     Patient ID: Jonathan Beltran is a 34 y.o. male who presents for Annual Exam.    Subjective       Influenza vaccination completed 03/2024   COVID 19 updated booster completed 03/2024     34  year old male here for annual examination  Jonathan Beltran reports he is doing well.     ADHD: taking Strattera  with benefit. Better concentration.    Obesity to Overweight: currently on wegovy . Weight loss. 92.1 kg   No fhx of MEN syndrome, medullary thyroid cancer. No prior pancreatitis or cholecystitis.   He maintains lifestyle medication including diet and exercise.Jonathan Beltran He continues lifestyle modification for over 6 months prior to starting Wegovy .    Asthma: no flares   Urinary hesitancy: he reports he noticed takes longer to initiate urine stream this started around 06/2022. Intermittent symptoms. Not consistent           + exercise Tai Chi  + smoke detector/ CO  + seat belts  No guns  + safe at home  + safe in relationship   No high risk sports      Problem List[1]  Current Outpatient Medications   Medication Instructions    albuterol  90 mcg/actuation inhaler 2 puffs, inhalation, Every 6 hours PRN    atomoxetine  (STRATTERA ) 80 mg, oral, Daily    polyethylene glycol (GLYCOLAX) 17 g, oral, Daily PRN    Wegovy  2.4 mg, subcutaneous, Every 7 days     Allergies[2]    Objective  Visit Vitals  BP 112/62 (BP Location: Right arm, Patient Position: Sitting, BP Cuff Size: Adult)   Pulse 83   Temp 36.2 C (97.1 F) (Temporal)   Ht 1.8 m   Wt 92.1 kg   SpO2 97%   BMI 28.42 kg/m   BSA 2.15 m     Physical Exam  Vitals as above  GEN: Well appearing  male in NAD, comfortable, pleasant. well dressed, appropriate  HEENT: NCAT, PERRLA, EOMI, neck supple, No cervical or supraclavicular lymphadenopathy  External ear and nose normal. TM clear bilaterally. sclera anicteric, conjunctiva clear no goiter, lesion. moist mucous  membranes. good dentition  CHEST: CTA B . good air movement.    COR: RRR, nl S1 and S2, no m.r.g. no heaves or thrills. jvp flat.    ABD: soft nabs, nttp, nd, no organomegaly  EXTREME: no cyanosis, clubbing or edema.    BACK: no CVAT no midline tenderness  SKIN: no rashes. good skin turgor  PULSES: 2+ carotid and 2+ radial and 2+ pop   PSYCH: good eye contact, appropriate, well dressed  NEURO: alert and oriented x 3, CN 2-12 intact bilaterally. motor strength 5/5 in D, B, T,WF,WE,G,I,G,H,Q,HL bilaterally. gait normal.       Assessment / Plan  Jonathan Beltran was seen today for annual exam.  Routine general medical examination at a health care facility  -     PSA Total, Screen; Future  -     CBC and differential; Future  -     Comprehensive metabolic panel; Future  -     Lipid panel; Future  -     Hemoglobin A1c; Future  Urinary hesitancy  -     Urinalysis reflex to culture  -     PSA Total, Screen; Future  -     CBC and differential; Future  -  Comprehensive metabolic panel; Future  Attention deficit hyperactivity disorder (ADHD), predominantly inattentive type  -     atomoxetine  (Strattera ) 80 mg capsule; Take 1 capsule (80 mg) by mouth once daily.  Overweight (BMI 25.0-29.9)  -     semaglutide , weight loss, (Wegovy ) 2.4 mg/0.75 mL pen injector; Inject 2.4 mg under the skin every 7 (seven) days.  -     Comprehensive metabolic panel; Future  -     Hemoglobin A1c; Future  Mild intermittent asthma without complication  Screening for prostate cancer  -     PSA Total, Screen; Future  Prediabetes  -     Comprehensive metabolic panel; Future  -     Hemoglobin A1c; Future  Hypercholesterolemia  -     Lipid panel; Future      34 year old male here for annaul examinaton . Jonathan Beltran is doing well.  His blood pressure is good. He is maintain his weight     ADHD: controlled  - continue atomoxetine  (Strattera )  - no side effects  - monitor     Obesity:    - BMI 28  - continue wegovy  for now  - consider decrease dose in future   - continue  healthy lifestyle changes diet and exercise      Urine hesitancy: intermittent symptoms  - could be weakening of bladder muscles from holding urine  - encouraged time urination to maintain good urinary bath room habits  - check UA    - repeat  PSA      Preventative  -tdap: 2024 due in 2034   -influenza: 03/2024  -COVID 19 02/2023  updated booster  03/2024   - Hepatitis C screening antibody negative in past  - cholesterol  04/2023 repeat   - hepatitis A completed 2025             Patient Health Questionnaire-2 Score: 0   Interpretation: Negative screening.     Follow-up & Interventions:   - Maintain annual screening - No additional Follow-up required.               [1]   Patient Active Problem List  Diagnosis    Mild intermittent asthma without complication    Attention deficit hyperactivity disorder (ADHD), predominantly inattentive type    Overweight (BMI 25.0-29.9)    Other constipation   [2]   Allergies  Allergen Reactions    Lobster      Nausea and vomiting     Latex Rash

## 2024-05-21 LAB — URINE MICROSCOPIC (REFLEX ONLY)
Bacteria Ur: NONE SEEN
Casts Ur: NONE SEEN /LPF
Epithelial Cells (non renal) Ur: NONE SEEN /HPF (ref 0–10)
RBC Ur: NONE SEEN /HPF (ref 0–2)
WBC Ur: NONE SEEN /HPF (ref 0–5)

## 2024-05-21 LAB — LIPID PANEL
Cholesterol: 143 mg/dL (ref 100–199)
HDL Cholesterol: 48 mg/dL (ref >39)
LDLc Calc (NIH): 85 mg/dL (ref 0–99)
Non-HDL Chol: 95 mg/dL (ref 0–129)
Triglycerides: 45 mg/dL (ref 0–149)
VLDLc Calc: 10 mg/dL (ref 5–40)

## 2024-05-21 LAB — CBC W/DIFF
Baso Abs: 0 x10E3/uL (ref 0.0–0.2)
Basos: 0 %
Eos Abs: 0.1 x10E3/uL (ref 0.0–0.4)
Eos: 2 %
Hct: 42.1 % (ref 37.5–51.0)
Hgb: 14.1 g/dL (ref 13.0–17.7)
Immature Grans Abs: 0 x10E3/uL (ref 0.0–0.1)
Immature Granulocytes: 0 %
Lymphs Abs: 1.4 x10E3/uL (ref 0.7–3.1)
Lymphs: 26 %
MCH: 31.1 pg (ref 26.6–33.0)
MCHC: 33.5 g/dL (ref 31.5–35.7)
MCV: 93 fL (ref 79–97)
Monocytes: 5 %
MonocytesAbs: 0.3 x10E3/uL (ref 0.1–0.9)
Neutrophils Abs: 3.6 x10E3/uL (ref 1.4–7.0)
Neutrophils: 67 %
Platelets: 191 x10E3/uL (ref 150–450)
RBC: 4.53 x10E6/uL (ref 4.14–5.80)
RDW: 12.6 % (ref 11.6–15.4)
WBC: 5.5 x10E3/uL (ref 3.4–10.8)

## 2024-05-21 LAB — COMPREHENSIVE METABOLIC PANEL
ALT: 24 IU/L (ref 0–44)
AST: 25 IU/L (ref 0–40)
Albumin: 4.5 g/dL (ref 4.1–5.1)
Alk Phosphatase: 93 IU/L (ref 47–123)
Anion Gap: 9 mmol/L — ABNORMAL LOW (ref 10.0–18.0)
BUN/Creat Ratio: 8 — ABNORMAL LOW (ref 9–20)
BUN: 10 mg/dL (ref 6–20)
Bili Total: 0.6 mg/dL (ref 0.0–1.2)
Calcium: 9.2 mg/dL (ref 8.7–10.2)
Carbon Dioxide: 29 mmol/L (ref 20–29)
Chloride: 104 mmol/L (ref 96–106)
Creat: 1.23 mg/dL (ref 0.76–1.27)
Globulin Total: 2.8 g/dL (ref 1.5–4.5)
Glucose: 83 mg/dL (ref 70–99)
Potassium: 4.5 mmol/L (ref 3.5–5.2)
Protein Total: 7.3 g/dL (ref 6.0–8.5)
Sodium: 142 mmol/L (ref 134–144)
eGFR: 79 mL/min/1.73 (ref >59)

## 2024-05-21 LAB — URINALYSIS REFLEX TO CULTURE
Bilirubin Ur: NEGATIVE
Glucose Ur: NEGATIVE
Ketones Ur: NEGATIVE
Nitrite Ur: NEGATIVE
Occult Blood Ur: NEGATIVE
Specific Gravity Ur: 1.023 (ref 1.005–1.030)
Urobilinogen,Semi-Qn Ur: 1 mg/dL (ref 0.2–1.0)
WBC Esterase Ur: NEGATIVE
pH Ur: 7 (ref 5.0–7.5)

## 2024-05-21 LAB — HEMOGLOBIN A1C: HgbA1C: 5.2 % (ref 4.8–5.6)

## 2024-05-21 LAB — PSA TOTAL, SCREEN: PSA TOTAL: 0.8 ng/mL (ref 0.0–4.0)

## 2024-05-24 ENCOUNTER — Ambulatory Visit: Admit: 2024-05-24 | Discharge: 2024-05-24 | Payer: BLUE CROSS/BLUE SHIELD | Primary: Internal Medicine

## 2024-05-24 DIAGNOSIS — F900 Attention-deficit hyperactivity disorder, predominantly inattentive type: Principal | ICD-10-CM

## 2024-05-24 NOTE — Progress Notes (Signed)
 [facility]  [address]  [address]  [place] [address]  Dept: [phone]  Dept Fax: [phone]    Visit Type: Follow Up      Patient: Jonathan Beltran Gender:  male    Referred by:  No ref. provider found DOB:  [birth year]   Accompanied by:  no one, alone. Age:  34 y.o.     Reason for Visit:   Watt Geiler presents today for follow up care of  ADHD.     Subjective/Objective:  Interval history/mental status/collateral info: Pt reports ADHD sx are stable. Feels he has attained a stable routine regarding completing his tasks; has been able to keep up to date on his notes without much issue.     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 There is a patient with ADHD that pt reports struggling with emotionally. Part of this is due to the external factors around the case situation; additionally there is a lot the patient can relate to as a person, given that he has struggled with ADHD himself from a young age.     Utilized emotion wheel to help pt process his emotions regarding this issue. Discussed pt setting realistic expectations for himself; explored role of examining our own biases.     CBT utilized throughout.     Pt presents with euthymic affect and congruent mood. Eye contact consistent, grooming and hygiene appropriate. Speech is normal in rate and tone. Thought process is logical.     Medications  Current Outpatient Medications   Medication Instructions    albuterol  90 mcg/actuation inhaler 2 puffs, inhalation, Every 6 hours PRN    atomoxetine  (STRATTERA ) 80 mg, oral, Daily    polyethylene glycol (GLYCOLAX) 17 g, oral, Daily PRN    Wegovy  2.4 mg, subcutaneous, Every 7 days     Allergies  Lobster and Latex    Mental Status Evaluation  Cognition:   grossly intact  Sensorium:   person, place, time/date, situation, and day of week  Appearance:   age appropriate and well dressed  Behavior:   normal  Speech:    normal pitch and normal volume  Mood:    euthymic  Affect:    mood-congruent  Thought Process:  normal  Thought Content:  normal  Insight:    Intact  Judgment:   Intact    Assessment/Plan   Diagnosis:    1. Attention deficit hyperactivity disorder (ADHD), predominantly inattentive type    2. Stress at work      Assessment:  Pt continues to meet criteria for ADHD. Inattentive sx consistent with dx. Sx exacerbated by low motivation, anticipatory anxiety, and cycles of avoidance. Maintains improvement in initiating tasks. Focus of today's session was to review current ADHD sx and to discuss impact of pt's current vocational stressors on his mental health. Pt demonstrates insight and motivation to improve coping ability.   Evaluation of the patients ability and capacity to respond to treatment: Engaged and help seeking    Plan:  Psychotherapy every 2-4 weeks. Reduce vocational and social impairment due to ADHD sx. Reduce anticipatory anxiety. Maintain improved task completion. Manage vocational stressors.      Safety Assessment:  Regarding safety, the patient has had thoughts of harm to self or others?   No . The patient has risk factors that include n/a, anxiety. Protective factors include access to psychiatric care , ongoing psychiatric care , strong community supports, future  orientation, and help-seeking behavior .  In considering these factors, the patient does not seem an acute risk for intentional harm to self or others.    Disposition/Follow Up: 06/28/2024  Follow up in about 5 weeks (around 06/28/2024).    Total time spent in face-to-face psychotherapy:  50 minutes    Will communicate plan with Care Team including primary care.     Electronic signature:  Curtistine Natter, [facility]  [facility]  Outpatient Psychiatry
